# Patient Record
Sex: Female | Born: 1960 | Race: Black or African American | Hispanic: No | Marital: Married | Smoking: Never smoker
Health system: Southern US, Community
[De-identification: ages and names within clinical notes are randomized; demographics above are authoritative.]

## PROBLEM LIST (undated history)

## (undated) DIAGNOSIS — Z923 Personal history of irradiation: Secondary | ICD-10-CM

## (undated) DIAGNOSIS — N809 Endometriosis, unspecified: Secondary | ICD-10-CM

## (undated) DIAGNOSIS — Z9221 Personal history of antineoplastic chemotherapy: Secondary | ICD-10-CM

## (undated) DIAGNOSIS — E119 Type 2 diabetes mellitus without complications: Secondary | ICD-10-CM

## (undated) DIAGNOSIS — Z9289 Personal history of other medical treatment: Secondary | ICD-10-CM

## (undated) DIAGNOSIS — C50912 Malignant neoplasm of unspecified site of left female breast: Secondary | ICD-10-CM

## (undated) DIAGNOSIS — D649 Anemia, unspecified: Secondary | ICD-10-CM

## (undated) DIAGNOSIS — C50919 Malignant neoplasm of unspecified site of unspecified female breast: Secondary | ICD-10-CM

## (undated) HISTORY — PX: WISDOM TOOTH EXTRACTION: SHX21

## (undated) HISTORY — DX: Malignant neoplasm of unspecified site of left female breast: C50.912

## (undated) HISTORY — DX: Endometriosis, unspecified: N80.9

## (undated) HISTORY — PX: REDUCTION MAMMAPLASTY: SUR839

## (undated) HISTORY — PX: BREAST REDUCTION SURGERY: SHX8

## (undated) HISTORY — DX: Anemia, unspecified: D64.9

## (undated) HISTORY — DX: Type 2 diabetes mellitus without complications: E11.9

## (undated) HISTORY — DX: Personal history of other medical treatment: Z92.89

---

## 1999-03-03 ENCOUNTER — Other Ambulatory Visit: Admission: RE | Admit: 1999-03-03 | Discharge: 1999-03-03 | Payer: Self-pay | Admitting: Obstetrics and Gynecology

## 2000-05-20 ENCOUNTER — Encounter: Admission: RE | Admit: 2000-05-20 | Discharge: 2000-05-20 | Payer: Self-pay | Admitting: Obstetrics and Gynecology

## 2000-05-20 ENCOUNTER — Encounter: Payer: Self-pay | Admitting: Obstetrics and Gynecology

## 2001-08-28 ENCOUNTER — Other Ambulatory Visit: Admission: RE | Admit: 2001-08-28 | Discharge: 2001-08-28 | Payer: Self-pay | Admitting: Obstetrics and Gynecology

## 2003-08-16 ENCOUNTER — Other Ambulatory Visit: Admission: RE | Admit: 2003-08-16 | Discharge: 2003-08-16 | Payer: Self-pay | Admitting: Family Medicine

## 2003-08-19 ENCOUNTER — Encounter: Admission: RE | Admit: 2003-08-19 | Discharge: 2003-08-19 | Payer: Self-pay | Admitting: Family Medicine

## 2004-07-02 ENCOUNTER — Emergency Department (HOSPITAL_COMMUNITY): Admission: EM | Admit: 2004-07-02 | Discharge: 2004-07-02 | Payer: Self-pay | Admitting: Family Medicine

## 2004-09-15 ENCOUNTER — Encounter: Admission: RE | Admit: 2004-09-15 | Discharge: 2004-09-15 | Payer: Self-pay | Admitting: Family Medicine

## 2004-09-27 ENCOUNTER — Encounter: Admission: RE | Admit: 2004-09-27 | Discharge: 2004-09-27 | Payer: Self-pay | Admitting: Family Medicine

## 2005-02-23 ENCOUNTER — Other Ambulatory Visit: Admission: RE | Admit: 2005-02-23 | Discharge: 2005-02-23 | Payer: Self-pay | Admitting: Family Medicine

## 2005-09-18 ENCOUNTER — Encounter: Admission: RE | Admit: 2005-09-18 | Discharge: 2005-09-18 | Payer: Self-pay | Admitting: Family Medicine

## 2006-07-18 ENCOUNTER — Other Ambulatory Visit: Admission: RE | Admit: 2006-07-18 | Discharge: 2006-07-18 | Payer: Self-pay | Admitting: Family Medicine

## 2006-09-24 ENCOUNTER — Encounter: Admission: RE | Admit: 2006-09-24 | Discharge: 2006-09-24 | Payer: Self-pay | Admitting: Family Medicine

## 2007-05-08 DIAGNOSIS — C50912 Malignant neoplasm of unspecified site of left female breast: Secondary | ICD-10-CM

## 2007-05-08 HISTORY — DX: Malignant neoplasm of unspecified site of left female breast: C50.912

## 2007-10-22 ENCOUNTER — Encounter: Admission: RE | Admit: 2007-10-22 | Discharge: 2007-10-22 | Payer: Self-pay | Admitting: Family Medicine

## 2007-11-10 ENCOUNTER — Encounter: Payer: Self-pay | Admitting: Obstetrics and Gynecology

## 2007-11-10 ENCOUNTER — Encounter: Admission: RE | Admit: 2007-11-10 | Discharge: 2007-11-10 | Payer: Self-pay | Admitting: Family Medicine

## 2007-11-10 ENCOUNTER — Encounter (INDEPENDENT_AMBULATORY_CARE_PROVIDER_SITE_OTHER): Payer: Self-pay | Admitting: Family Medicine

## 2007-11-18 ENCOUNTER — Encounter: Admission: RE | Admit: 2007-11-18 | Discharge: 2007-11-18 | Payer: Self-pay | Admitting: Family Medicine

## 2007-12-06 HISTORY — PX: ABDOMINAL HYSTERECTOMY: SHX81

## 2007-12-08 ENCOUNTER — Encounter: Admission: RE | Admit: 2007-12-08 | Discharge: 2007-12-08 | Payer: Self-pay | Admitting: Surgery

## 2007-12-08 ENCOUNTER — Encounter (INDEPENDENT_AMBULATORY_CARE_PROVIDER_SITE_OTHER): Payer: Self-pay | Admitting: Surgery

## 2007-12-08 ENCOUNTER — Ambulatory Visit (HOSPITAL_COMMUNITY): Admission: RE | Admit: 2007-12-08 | Discharge: 2007-12-08 | Payer: Self-pay | Admitting: Surgery

## 2007-12-09 ENCOUNTER — Ambulatory Visit: Payer: Self-pay | Admitting: Oncology

## 2007-12-25 ENCOUNTER — Ambulatory Visit (HOSPITAL_COMMUNITY): Admission: RE | Admit: 2007-12-25 | Discharge: 2007-12-25 | Payer: Self-pay | Admitting: Oncology

## 2007-12-25 ENCOUNTER — Encounter: Payer: Self-pay | Admitting: Oncology

## 2008-01-20 ENCOUNTER — Inpatient Hospital Stay (HOSPITAL_COMMUNITY): Admission: RE | Admit: 2008-01-20 | Discharge: 2008-01-22 | Payer: Self-pay | Admitting: Surgery

## 2008-01-20 ENCOUNTER — Encounter (INDEPENDENT_AMBULATORY_CARE_PROVIDER_SITE_OTHER): Payer: Self-pay | Admitting: Surgery

## 2008-01-20 HISTORY — PX: BREAST LUMPECTOMY: SHX2

## 2008-02-09 ENCOUNTER — Ambulatory Visit: Payer: Self-pay | Admitting: Oncology

## 2008-02-11 LAB — COMPREHENSIVE METABOLIC PANEL
ALT: 13 U/L (ref 0–35)
AST: 17 U/L (ref 0–37)
Albumin: 3.8 g/dL (ref 3.5–5.2)
Calcium: 9.3 mg/dL (ref 8.4–10.5)
Chloride: 105 mEq/L (ref 96–112)
Potassium: 3.7 mEq/L (ref 3.5–5.3)

## 2008-02-11 LAB — CBC & DIFF AND RETIC
Basophils Absolute: 0 10*3/uL (ref 0.0–0.1)
Eosinophils Absolute: 0.3 10*3/uL (ref 0.0–0.5)
HCT: 31.7 % — ABNORMAL LOW (ref 34.8–46.6)
HGB: 10.3 g/dL — ABNORMAL LOW (ref 11.6–15.9)
IRF: 0.35 — ABNORMAL HIGH (ref 0.130–0.330)
MCV: 80.2 fL — ABNORMAL LOW (ref 81.0–101.0)
MONO%: 6.1 % (ref 0.0–13.0)
NEUT#: 3.4 10*3/uL (ref 1.5–6.5)
RDW: 14.1 % (ref 11.3–14.5)
RETIC #: 51.4 10*3/uL (ref 19.7–115.1)
lymph#: 2.1 10*3/uL (ref 0.9–3.3)

## 2008-02-20 LAB — CBC WITH DIFFERENTIAL/PLATELET
Basophils Absolute: 0 10*3/uL (ref 0.0–0.1)
Eosinophils Absolute: 0.1 10*3/uL (ref 0.0–0.5)
MCH: 25.8 pg — ABNORMAL LOW (ref 26.0–34.0)
MCHC: 32.7 g/dL (ref 32.0–36.0)
MONO%: 1.3 % (ref 0.0–13.0)
NEUT%: 13.2 % — ABNORMAL LOW (ref 39.6–76.8)
RDW: 14.3 % (ref 11.3–14.5)
WBC: 0.9 10*3/uL — CL (ref 3.9–10.0)

## 2008-02-20 LAB — URINALYSIS, MICROSCOPIC - CHCC
Bilirubin (Urine): NEGATIVE
Glucose: NEGATIVE g/dL
Ketones: NEGATIVE mg/dL
Leukocyte Esterase: NEGATIVE
Nitrite: NEGATIVE
Protein: <30 mg/dL
Specific Gravity, Urine: 1.015 (ref 1.003–1.035)
pH: 6 (ref 4.6–8.0)

## 2008-02-22 LAB — URINE CULTURE

## 2008-02-23 LAB — CBC WITH DIFFERENTIAL/PLATELET
Basophils Absolute: 0.1 10*3/uL (ref 0.0–0.1)
Eosinophils Absolute: 0.1 10*3/uL (ref 0.0–0.5)
HGB: 9.5 g/dL — ABNORMAL LOW (ref 11.6–15.9)
NEUT#: 2.4 10*3/uL (ref 1.5–6.5)
RDW: 13.4 % (ref 11.3–14.5)
lymph#: 1.2 10*3/uL (ref 0.9–3.3)

## 2008-02-27 LAB — COMPREHENSIVE METABOLIC PANEL
ALT: 24 U/L (ref 0–35)
AST: 23 U/L (ref 0–37)
Alkaline Phosphatase: 104 U/L (ref 39–117)
BUN: 12 mg/dL (ref 6–23)
Calcium: 9.4 mg/dL (ref 8.4–10.5)
Chloride: 106 mEq/L (ref 96–112)
Creatinine, Ser: 0.84 mg/dL (ref 0.40–1.20)
Potassium: 4.2 mEq/L (ref 3.5–5.3)

## 2008-02-27 LAB — CBC WITH DIFFERENTIAL/PLATELET
Eosinophils Absolute: 0 10*3/uL (ref 0.0–0.5)
HCT: 31.9 % — ABNORMAL LOW (ref 34.8–46.6)
HGB: 10 g/dL — ABNORMAL LOW (ref 11.6–15.9)
LYMPH%: 18.3 % (ref 14.0–48.0)
MONO#: 0.9 10*3/uL (ref 0.1–0.9)
NEUT#: 8 10*3/uL — ABNORMAL HIGH (ref 1.5–6.5)
NEUT%: 71.9 % (ref 39.6–76.8)
Platelets: 197 10*3/uL (ref 145–400)
WBC: 11.1 10*3/uL — ABNORMAL HIGH (ref 3.9–10.0)

## 2008-02-27 LAB — TECHNOLOGIST REVIEW: Technologist Review: 1

## 2008-03-04 LAB — CBC WITH DIFFERENTIAL/PLATELET
BASO%: 0.8 % (ref 0.0–2.0)
Basophils Absolute: 0 10*3/uL (ref 0.0–0.1)
EOS%: 0 % (ref 0.0–7.0)
HCT: 26.6 % — ABNORMAL LOW (ref 34.8–46.6)
HGB: 8.8 g/dL — ABNORMAL LOW (ref 11.6–15.9)
LYMPH%: 40.2 % (ref 14.0–48.0)
MCH: 26.1 pg (ref 26.0–34.0)
MCHC: 33 g/dL (ref 32.0–36.0)
MCV: 79.1 fL — ABNORMAL LOW (ref 81.0–101.0)
MONO%: 1.9 % (ref 0.0–13.0)
NEUT%: 57.1 % (ref 39.6–76.8)

## 2008-03-08 LAB — CBC & DIFF AND RETIC
EOS%: 0 % (ref 0.0–7.0)
Eosinophils Absolute: 0 10*3/uL (ref 0.0–0.5)
MCV: 79.1 fL — ABNORMAL LOW (ref 81.0–101.0)
MONO%: 16.6 % — ABNORMAL HIGH (ref 0.0–13.0)
NEUT#: 2.2 10*3/uL (ref 1.5–6.5)
RBC: 3.44 10*6/uL — ABNORMAL LOW (ref 3.70–5.32)
RDW: 14.2 % (ref 11.3–14.5)
RETIC #: 37.5 10*3/uL (ref 19.7–115.1)
Retic %: 1.1 % (ref 0.4–2.3)
WBC: 3.9 10*3/uL (ref 3.9–10.0)
lymph#: 1 10*3/uL (ref 0.9–3.3)

## 2008-03-12 LAB — CBC WITH DIFFERENTIAL/PLATELET
BASO%: 0 % (ref 0.0–2.0)
Basophils Absolute: 0 10*3/uL (ref 0.0–0.1)
EOS%: 0.1 % (ref 0.0–7.0)
Eosinophils Absolute: 0 10*3/uL (ref 0.0–0.5)
HCT: 29.6 % — ABNORMAL LOW (ref 34.8–46.6)
HGB: 9.8 g/dL — ABNORMAL LOW (ref 11.6–15.9)
LYMPH%: 11.9 % — ABNORMAL LOW (ref 14.0–48.0)
MCH: 26.4 pg (ref 26.0–34.0)
MCHC: 33.1 g/dL (ref 32.0–36.0)
MCV: 79.8 fL — ABNORMAL LOW (ref 81.0–101.0)
MONO#: 1.2 10*3/uL — ABNORMAL HIGH (ref 0.1–0.9)
MONO%: 9.5 % (ref 0.0–13.0)
NEUT#: 10 10*3/uL — ABNORMAL HIGH (ref 1.5–6.5)
NEUT%: 78.5 % — ABNORMAL HIGH (ref 39.6–76.8)
Platelets: 163 10*3/uL (ref 145–400)
RBC: 3.72 10*6/uL (ref 3.70–5.32)
RDW: 15.1 % — ABNORMAL HIGH (ref 11.3–14.5)
WBC: 12.7 10*3/uL — ABNORMAL HIGH (ref 3.9–10.0)
lymph#: 1.5 10*3/uL (ref 0.9–3.3)

## 2008-03-19 ENCOUNTER — Encounter (HOSPITAL_COMMUNITY): Admission: RE | Admit: 2008-03-19 | Discharge: 2008-05-06 | Payer: Self-pay | Admitting: Oncology

## 2008-03-19 LAB — CBC & DIFF AND RETIC
BASO%: 0.8 % (ref 0.0–2.0)
HCT: 24.5 % — ABNORMAL LOW (ref 34.8–46.6)
LYMPH%: 29.4 % (ref 14.0–48.0)
MCH: 26.2 pg (ref 26.0–34.0)
MCHC: 33.2 g/dL (ref 32.0–36.0)
MCV: 78.9 fL — ABNORMAL LOW (ref 81.0–101.0)
MONO#: 0.1 10*3/uL (ref 0.1–0.9)
NEUT%: 62.6 % (ref 39.6–76.8)
Platelets: 105 10*3/uL — ABNORMAL LOW (ref 145–400)
WBC: 1.3 10*3/uL — ABNORMAL LOW (ref 3.9–10.0)

## 2008-03-19 LAB — FERRITIN: Ferritin: 868 ng/mL — ABNORMAL HIGH (ref 10–291)

## 2008-03-23 LAB — CBC & DIFF AND RETIC
Basophils Absolute: 0 10*3/uL (ref 0.0–0.1)
EOS%: 0.1 % (ref 0.0–7.0)
Eosinophils Absolute: 0 10*3/uL (ref 0.0–0.5)
HCT: 36.9 % (ref 34.8–46.6)
HGB: 12.3 g/dL (ref 11.6–15.9)
IRF: 0.38 — ABNORMAL HIGH (ref 0.130–0.330)
MCH: 27.5 pg (ref 26.0–34.0)
MONO#: 0.9 10*3/uL (ref 0.1–0.9)
NEUT#: 4.2 10*3/uL (ref 1.5–6.5)
NEUT%: 70.4 % (ref 39.6–76.8)
RETIC #: 68.9 10*3/uL (ref 19.7–115.1)
lymph#: 0.9 10*3/uL (ref 0.9–3.3)

## 2008-03-23 LAB — HEMOGLOBINOPATHY EVALUATION
Hemoglobin Other: 0 % (ref 0.0–0.0)
Hgb A: 95.9 % — ABNORMAL LOW (ref 96.8–97.8)
Hgb F Quant: 1 % (ref 0.0–2.0)
Hgb S Quant: 0 % (ref 0.0–0.0)

## 2008-03-23 LAB — CHCC SMEAR

## 2008-03-23 LAB — MORPHOLOGY

## 2008-03-26 ENCOUNTER — Ambulatory Visit: Payer: Self-pay | Admitting: Oncology

## 2008-03-26 LAB — CBC WITH DIFFERENTIAL/PLATELET
BASO%: 1.1 % (ref 0.0–2.0)
EOS%: 0.7 % (ref 0.0–7.0)
MCH: 26.8 pg (ref 26.0–34.0)
MCHC: 32.8 g/dL (ref 32.0–36.0)
MONO#: 1 10*3/uL — ABNORMAL HIGH (ref 0.1–0.9)
NEUT%: 74.3 % (ref 39.6–76.8)
RBC: 5.1 10*6/uL (ref 3.70–5.32)
RDW: 17.7 % — ABNORMAL HIGH (ref 11.3–14.5)
WBC: 10.8 10*3/uL — ABNORMAL HIGH (ref 3.9–10.0)
lymph#: 1.6 10*3/uL (ref 0.9–3.3)

## 2008-03-26 LAB — COMPREHENSIVE METABOLIC PANEL
ALT: 15 U/L (ref 0–35)
AST: 18 U/L (ref 0–37)
CO2: 25 mEq/L (ref 19–32)
Calcium: 9.2 mg/dL (ref 8.4–10.5)
Chloride: 106 mEq/L (ref 96–112)
Potassium: 3.9 mEq/L (ref 3.5–5.3)
Sodium: 139 mEq/L (ref 135–145)
Total Protein: 7 g/dL (ref 6.0–8.3)

## 2008-04-05 LAB — CBC WITH DIFFERENTIAL/PLATELET
Basophils Absolute: 0 10*3/uL (ref 0.0–0.1)
EOS%: 0.2 % (ref 0.0–7.0)
HGB: 11.1 g/dL — ABNORMAL LOW (ref 11.6–15.9)
MCH: 27.4 pg (ref 26.0–34.0)
MONO#: 0.7 10*3/uL (ref 0.1–0.9)
NEUT#: 2.6 10*3/uL (ref 1.5–6.5)
RDW: 17.5 % — ABNORMAL HIGH (ref 11.3–14.5)
WBC: 3.9 10*3/uL (ref 3.9–10.0)
lymph#: 0.6 10*3/uL — ABNORMAL LOW (ref 0.9–3.3)

## 2008-04-09 ENCOUNTER — Ambulatory Visit: Payer: Self-pay | Admitting: Surgery

## 2008-04-09 ENCOUNTER — Ambulatory Visit: Admission: RE | Admit: 2008-04-09 | Discharge: 2008-04-09 | Payer: Self-pay | Admitting: Oncology

## 2008-04-09 ENCOUNTER — Encounter: Payer: Self-pay | Admitting: Oncology

## 2008-04-09 LAB — CBC WITH DIFFERENTIAL/PLATELET
Basophils Absolute: 0.1 10*3/uL (ref 0.0–0.1)
HCT: 33.8 % — ABNORMAL LOW (ref 34.8–46.6)
HGB: 11.3 g/dL — ABNORMAL LOW (ref 11.6–15.9)
MONO#: 0.9 10*3/uL (ref 0.1–0.9)
NEUT#: 7.9 10*3/uL — ABNORMAL HIGH (ref 1.5–6.5)
NEUT%: 79.8 % — ABNORMAL HIGH (ref 39.6–76.8)
WBC: 9.9 10*3/uL (ref 3.9–10.0)
lymph#: 1 10*3/uL (ref 0.9–3.3)

## 2008-04-16 LAB — CBC WITH DIFFERENTIAL/PLATELET
Basophils Absolute: 0.1 10*3/uL (ref 0.0–0.1)
EOS%: 0.5 % (ref 0.0–7.0)
Eosinophils Absolute: 0 10*3/uL (ref 0.0–0.5)
HCT: 32.8 % — ABNORMAL LOW (ref 34.8–46.6)
HGB: 10.9 g/dL — ABNORMAL LOW (ref 11.6–15.9)
MCH: 26.6 pg (ref 26.0–34.0)
MCV: 79.8 fL — ABNORMAL LOW (ref 81.0–101.0)
MONO%: 6.2 % (ref 0.0–13.0)
NEUT%: 80 % — ABNORMAL HIGH (ref 39.6–76.8)
lymph#: 0.6 10*3/uL — ABNORMAL LOW (ref 0.9–3.3)

## 2008-04-23 LAB — COMPREHENSIVE METABOLIC PANEL
ALT: 68 U/L — ABNORMAL HIGH (ref 0–35)
AST: 37 U/L (ref 0–37)
CO2: 25 mEq/L (ref 19–32)
Chloride: 107 mEq/L (ref 96–112)
Creatinine, Ser: 0.71 mg/dL (ref 0.40–1.20)
Sodium: 143 mEq/L (ref 135–145)
Total Bilirubin: 0.3 mg/dL (ref 0.3–1.2)
Total Protein: 6.9 g/dL (ref 6.0–8.3)

## 2008-04-23 LAB — CBC & DIFF AND RETIC
BASO%: 0.2 % (ref 0.0–2.0)
Basophils Absolute: 0 10*3/uL (ref 0.0–0.1)
EOS%: 1 % (ref 0.0–7.0)
HCT: 31.3 % — ABNORMAL LOW (ref 34.8–46.6)
HGB: 10.5 g/dL — ABNORMAL LOW (ref 11.6–15.9)
IRF: 0.29 (ref 0.130–0.330)
MCH: 27.6 pg (ref 26.0–34.0)
MONO#: 0.3 10*3/uL (ref 0.1–0.9)
NEUT%: 82.7 % — ABNORMAL HIGH (ref 39.6–76.8)
RDW: 20.3 % — ABNORMAL HIGH (ref 11.3–14.5)
RETIC #: 55.4 10*3/uL (ref 19.7–115.1)
WBC: 6.5 10*3/uL (ref 3.9–10.0)
lymph#: 0.7 10*3/uL — ABNORMAL LOW (ref 0.9–3.3)

## 2008-04-29 LAB — CBC WITH DIFFERENTIAL/PLATELET
BASO%: 1.3 % (ref 0.0–2.0)
Basophils Absolute: 0.1 10*3/uL (ref 0.0–0.1)
EOS%: 2.9 % (ref 0.0–7.0)
HCT: 31 % — ABNORMAL LOW (ref 34.8–46.6)
HGB: 10.4 g/dL — ABNORMAL LOW (ref 11.6–15.9)
LYMPH%: 15.5 % (ref 14.0–48.0)
MCH: 27.4 pg (ref 26.0–34.0)
MCHC: 33.6 g/dL (ref 32.0–36.0)
MCV: 81.5 fL (ref 81.0–101.0)
MONO%: 6.6 % (ref 0.0–13.0)
NEUT%: 73.8 % (ref 39.6–76.8)

## 2008-05-06 LAB — CBC WITH DIFFERENTIAL/PLATELET
BASO%: 0.3 % (ref 0.0–2.0)
Basophils Absolute: 0 10*3/uL (ref 0.0–0.1)
EOS%: 2.6 % (ref 0.0–7.0)
MCH: 28.4 pg (ref 26.0–34.0)
MCHC: 33.5 g/dL (ref 32.0–36.0)
MCV: 84.7 fL (ref 81.0–101.0)
MONO%: 4 % (ref 0.0–13.0)
RBC: 3.55 10*6/uL — ABNORMAL LOW (ref 3.70–5.32)
RDW: 23.4 % — ABNORMAL HIGH (ref 11.3–14.5)

## 2008-05-11 ENCOUNTER — Ambulatory Visit: Payer: Self-pay | Admitting: Oncology

## 2008-05-13 LAB — COMPREHENSIVE METABOLIC PANEL
Albumin: 3.5 g/dL (ref 3.5–5.2)
CO2: 29 mEq/L (ref 19–32)
Calcium: 9 mg/dL (ref 8.4–10.5)
Chloride: 103 mEq/L (ref 96–112)
Glucose, Bld: 168 mg/dL — ABNORMAL HIGH (ref 70–99)
Potassium: 3.5 mEq/L (ref 3.5–5.3)
Sodium: 140 mEq/L (ref 135–145)
Total Bilirubin: 0.5 mg/dL (ref 0.3–1.2)
Total Protein: 6.7 g/dL (ref 6.0–8.3)

## 2008-05-13 LAB — CBC & DIFF AND RETIC
BASO%: 0.7 % (ref 0.0–2.0)
Basophils Absolute: 0 10*3/uL (ref 0.0–0.1)
HCT: 30.3 % — ABNORMAL LOW (ref 34.8–46.6)
LYMPH%: 21.2 % (ref 14.0–48.0)
MCHC: 33.6 g/dL (ref 32.0–36.0)
MONO#: 0.2 10*3/uL (ref 0.1–0.9)
NEUT%: 67.5 % (ref 39.6–76.8)
Platelets: 195 10*3/uL (ref 145–400)
WBC: 3.6 10*3/uL — ABNORMAL LOW (ref 3.9–10.0)

## 2008-05-13 LAB — FERRITIN: Ferritin: 693 ng/mL — ABNORMAL HIGH (ref 10–291)

## 2008-05-28 ENCOUNTER — Ambulatory Visit: Admission: RE | Admit: 2008-05-28 | Discharge: 2008-08-17 | Payer: Self-pay | Admitting: Radiation Oncology

## 2008-06-02 ENCOUNTER — Encounter: Admission: RE | Admit: 2008-06-02 | Discharge: 2008-06-02 | Payer: Self-pay | Admitting: Radiation Oncology

## 2008-06-04 LAB — COMPREHENSIVE METABOLIC PANEL
ALT: 38 U/L — ABNORMAL HIGH (ref 0–35)
AST: 31 U/L (ref 0–37)
CO2: 28 mEq/L (ref 19–32)
Calcium: 8.9 mg/dL (ref 8.4–10.5)
Chloride: 102 mEq/L (ref 96–112)
Sodium: 138 mEq/L (ref 135–145)
Total Protein: 6.7 g/dL (ref 6.0–8.3)

## 2008-06-04 LAB — CBC WITH DIFFERENTIAL/PLATELET
BASO%: 0.4 % (ref 0.0–2.0)
EOS%: 1.8 % (ref 0.0–7.0)
MCH: 29.5 pg (ref 26.0–34.0)
MCHC: 33.6 g/dL (ref 32.0–36.0)
MONO#: 0.3 10*3/uL (ref 0.1–0.9)
RBC: 3.77 10*6/uL (ref 3.70–5.32)
RDW: 17.5 % — ABNORMAL HIGH (ref 11.3–14.5)
WBC: 4.1 10*3/uL (ref 3.9–10.0)
lymph#: 0.9 10*3/uL (ref 0.9–3.3)

## 2008-06-04 LAB — URINALYSIS, MICROSCOPIC - CHCC
Ketones: NEGATIVE mg/dL
Protein: NEGATIVE mg/dL
Specific Gravity, Urine: 1.015 (ref 1.003–1.035)
pH: 7 (ref 4.6–8.0)

## 2008-06-06 LAB — URINE CULTURE

## 2008-06-25 LAB — CBC WITH DIFFERENTIAL/PLATELET
BASO%: 0.5 % (ref 0.0–2.0)
EOS%: 6.2 % (ref 0.0–7.0)
MCH: 28.5 pg (ref 25.1–34.0)
MCHC: 32.6 g/dL (ref 31.5–36.0)
MONO#: 0.2 10*3/uL (ref 0.1–0.9)
RBC: 3.89 10*6/uL (ref 3.70–5.45)
RDW: 13.5 % (ref 11.2–14.5)
WBC: 3.9 10*3/uL (ref 3.9–10.3)
lymph#: 0.7 10*3/uL — ABNORMAL LOW (ref 0.9–3.3)

## 2008-06-25 LAB — COMPREHENSIVE METABOLIC PANEL
ALT: 27 U/L (ref 0–35)
AST: 20 U/L (ref 0–37)
Creatinine, Ser: 0.9 mg/dL (ref 0.40–1.20)
Total Bilirubin: 0.3 mg/dL (ref 0.3–1.2)

## 2008-07-26 ENCOUNTER — Ambulatory Visit: Payer: Self-pay | Admitting: Oncology

## 2008-07-28 LAB — CBC WITH DIFFERENTIAL/PLATELET
Basophils Absolute: 0 10*3/uL (ref 0.0–0.1)
Eosinophils Absolute: 0.3 10*3/uL (ref 0.0–0.5)
HGB: 12.4 g/dL (ref 11.6–15.9)
LYMPH%: 15.9 % (ref 14.0–49.7)
MCV: 86.4 fL (ref 79.5–101.0)
MONO#: 0.2 10*3/uL (ref 0.1–0.9)
MONO%: 8.2 % (ref 0.0–14.0)
NEUT#: 1.8 10*3/uL (ref 1.5–6.5)
Platelets: 158 10*3/uL (ref 145–400)
RBC: 4.32 10*6/uL (ref 3.70–5.45)
WBC: 2.8 10*3/uL — ABNORMAL LOW (ref 3.9–10.3)

## 2008-07-28 LAB — COMPREHENSIVE METABOLIC PANEL
Albumin: 3.6 g/dL (ref 3.5–5.2)
BUN: 9 mg/dL (ref 6–23)
CO2: 30 mEq/L (ref 19–32)
Glucose, Bld: 136 mg/dL — ABNORMAL HIGH (ref 70–99)
Potassium: 3.6 mEq/L (ref 3.5–5.3)
Sodium: 141 mEq/L (ref 135–145)
Total Bilirubin: 0.5 mg/dL (ref 0.3–1.2)
Total Protein: 7.2 g/dL (ref 6.0–8.3)

## 2008-07-29 LAB — CANCER ANTIGEN 27.29: CA 27.29: 21 U/mL (ref 0–39)

## 2008-07-29 LAB — VITAMIN D 25 HYDROXY (VIT D DEFICIENCY, FRACTURES): Vit D, 25-Hydroxy: 29 ng/mL — ABNORMAL LOW (ref 30–89)

## 2008-08-31 ENCOUNTER — Ambulatory Visit (HOSPITAL_BASED_OUTPATIENT_CLINIC_OR_DEPARTMENT_OTHER): Admission: RE | Admit: 2008-08-31 | Discharge: 2008-08-31 | Payer: Self-pay | Admitting: Surgery

## 2008-11-01 ENCOUNTER — Ambulatory Visit: Payer: Self-pay | Admitting: Oncology

## 2008-11-03 LAB — CBC & DIFF AND RETIC
Basophils Absolute: 0 10*3/uL (ref 0.0–0.1)
EOS%: 4 % (ref 0.0–7.0)
Eosinophils Absolute: 0.1 10*3/uL (ref 0.0–0.5)
HCT: 34.7 % — ABNORMAL LOW (ref 34.8–46.6)
HGB: 11.7 g/dL (ref 11.6–15.9)
LYMPH%: 23 % (ref 14.0–49.7)
MCH: 29.3 pg (ref 25.1–34.0)
MCV: 86.9 fL (ref 79.5–101.0)
MONO%: 8 % (ref 0.0–14.0)
NEUT#: 2.4 10*3/uL (ref 1.5–6.5)
NEUT%: 64.6 % (ref 38.4–76.8)
Platelets: 157 10*3/uL (ref 145–400)
RDW: 14.8 % — ABNORMAL HIGH (ref 11.2–14.5)
RETIC #: 70 10*3/uL (ref 19.7–115.1)

## 2008-11-18 ENCOUNTER — Encounter: Admission: RE | Admit: 2008-11-18 | Discharge: 2008-11-18 | Payer: Self-pay | Admitting: Oncology

## 2009-04-14 ENCOUNTER — Ambulatory Visit: Payer: Self-pay | Admitting: Oncology

## 2009-04-18 LAB — CBC WITH DIFFERENTIAL/PLATELET
BASO%: 0.4 % (ref 0.0–2.0)
Eosinophils Absolute: 0.1 10*3/uL (ref 0.0–0.5)
LYMPH%: 25.9 % (ref 14.0–49.7)
MCHC: 33.5 g/dL (ref 31.5–36.0)
MCV: 89.8 fL (ref 79.5–101.0)
MONO#: 0.4 10*3/uL (ref 0.1–0.9)
MONO%: 9.3 % (ref 0.0–14.0)
NEUT#: 3 10*3/uL (ref 1.5–6.5)
RBC: 3.92 10*6/uL (ref 3.70–5.45)
RDW: 13.6 % (ref 11.2–14.5)
WBC: 4.8 10*3/uL (ref 3.9–10.3)

## 2009-04-19 LAB — COMPREHENSIVE METABOLIC PANEL
ALT: 16 U/L (ref 0–35)
AST: 16 U/L (ref 0–37)
Albumin: 4.1 g/dL (ref 3.5–5.2)
Alkaline Phosphatase: 105 U/L (ref 39–117)
Glucose, Bld: 76 mg/dL (ref 70–99)
Potassium: 3.9 mEq/L (ref 3.5–5.3)
Sodium: 141 mEq/L (ref 135–145)
Total Bilirubin: 0.2 mg/dL — ABNORMAL LOW (ref 0.3–1.2)
Total Protein: 7.1 g/dL (ref 6.0–8.3)

## 2009-04-19 LAB — FERRITIN: Ferritin: 345 ng/mL — ABNORMAL HIGH (ref 10–291)

## 2009-04-27 ENCOUNTER — Ambulatory Visit: Admission: RE | Admit: 2009-04-27 | Discharge: 2009-04-28 | Payer: Self-pay | Admitting: Radiation Oncology

## 2009-04-27 ENCOUNTER — Ambulatory Visit (HOSPITAL_BASED_OUTPATIENT_CLINIC_OR_DEPARTMENT_OTHER): Admission: RE | Admit: 2009-04-27 | Discharge: 2009-04-27 | Payer: Self-pay | Admitting: Plastic Surgery

## 2009-05-18 ENCOUNTER — Encounter: Admission: RE | Admit: 2009-05-18 | Discharge: 2009-07-19 | Payer: Self-pay | Admitting: Oncology

## 2009-08-12 ENCOUNTER — Emergency Department (HOSPITAL_COMMUNITY): Admission: EM | Admit: 2009-08-12 | Discharge: 2009-08-13 | Payer: Self-pay | Admitting: Emergency Medicine

## 2009-08-13 ENCOUNTER — Ambulatory Visit: Payer: Self-pay | Admitting: Vascular Surgery

## 2009-08-13 ENCOUNTER — Encounter (INDEPENDENT_AMBULATORY_CARE_PROVIDER_SITE_OTHER): Payer: Self-pay | Admitting: Emergency Medicine

## 2009-08-13 ENCOUNTER — Ambulatory Visit (HOSPITAL_COMMUNITY): Admission: RE | Admit: 2009-08-13 | Discharge: 2009-08-13 | Payer: Self-pay | Admitting: Emergency Medicine

## 2009-10-14 ENCOUNTER — Ambulatory Visit: Payer: Self-pay | Admitting: Oncology

## 2009-10-18 LAB — COMPREHENSIVE METABOLIC PANEL
ALT: 20 U/L (ref 0–35)
Albumin: 3.7 g/dL (ref 3.5–5.2)
BUN: 12 mg/dL (ref 6–23)
Calcium: 8.9 mg/dL (ref 8.4–10.5)
Glucose, Bld: 105 mg/dL — ABNORMAL HIGH (ref 70–99)
Potassium: 3.7 mEq/L (ref 3.5–5.3)
Sodium: 142 mEq/L (ref 135–145)

## 2009-10-18 LAB — VITAMIN D 25 HYDROXY (VIT D DEFICIENCY, FRACTURES): Vit D, 25-Hydroxy: 19 ng/mL — ABNORMAL LOW (ref 30–89)

## 2009-10-18 LAB — CBC WITH DIFFERENTIAL/PLATELET
BASO%: 0.3 % (ref 0.0–2.0)
EOS%: 2.1 % (ref 0.0–7.0)
HCT: 35.3 % (ref 34.8–46.6)
MCH: 30 pg (ref 25.1–34.0)
MCV: 88.5 fL (ref 79.5–101.0)
MONO%: 9.2 % (ref 0.0–14.0)
NEUT#: 2.4 10*3/uL (ref 1.5–6.5)
Platelets: 171 10*3/uL (ref 145–400)
WBC: 4.2 10*3/uL (ref 3.9–10.3)

## 2009-10-19 ENCOUNTER — Encounter: Admission: RE | Admit: 2009-10-19 | Discharge: 2009-10-19 | Payer: Self-pay | Admitting: Family Medicine

## 2009-10-26 ENCOUNTER — Encounter: Payer: Self-pay | Admitting: Oncology

## 2009-10-26 ENCOUNTER — Ambulatory Visit: Payer: Self-pay | Admitting: Vascular Surgery

## 2009-10-26 ENCOUNTER — Ambulatory Visit: Admission: RE | Admit: 2009-10-26 | Discharge: 2009-10-26 | Payer: Self-pay | Admitting: Oncology

## 2009-11-21 ENCOUNTER — Ambulatory Visit: Payer: Self-pay | Admitting: Oncology

## 2009-11-21 ENCOUNTER — Ambulatory Visit (HOSPITAL_COMMUNITY): Admission: RE | Admit: 2009-11-21 | Discharge: 2009-11-21 | Payer: Self-pay | Admitting: Oncology

## 2009-11-21 ENCOUNTER — Encounter: Admission: RE | Admit: 2009-11-21 | Discharge: 2009-11-21 | Payer: Self-pay | Admitting: Oncology

## 2010-03-07 IMAGING — CT CT PELVIS W/ CM
2 of 5 series · 16 of 46 positions shown, 18 images · IV contrast (agent unspecified)
Comparison: None

CT CHEST

CLINICAL DATA: Left breast cancer, lumpectomy.  Chemotherapy to
follow

CT CHEST, ABDOMEN AND PELVIS WITH CONTRAST
TECHNIQUE: Multidetector CT imaging of the chest, abdomen and
pelvis was performed following the standard protocol during bolus
administration of intravenous contrast.
Contrast: 100 ml  Omni 300

[Series 2: cap 5.0 b40f · axial · 0.68mm/px · z∈[-553,-23]mm · 13 of 120 slices shown, 15 images]
[im 7/120  soft-tissue]
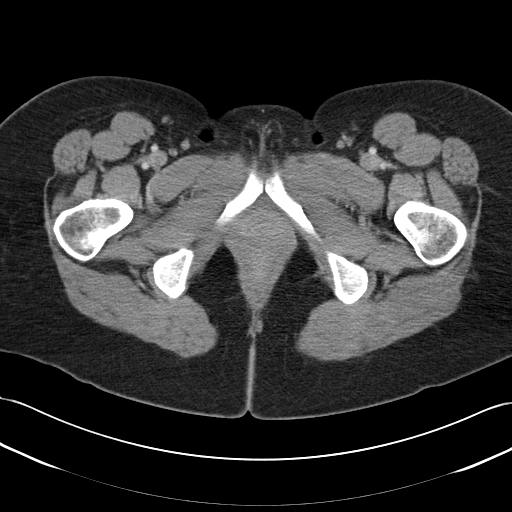
[im 7/120  bone]
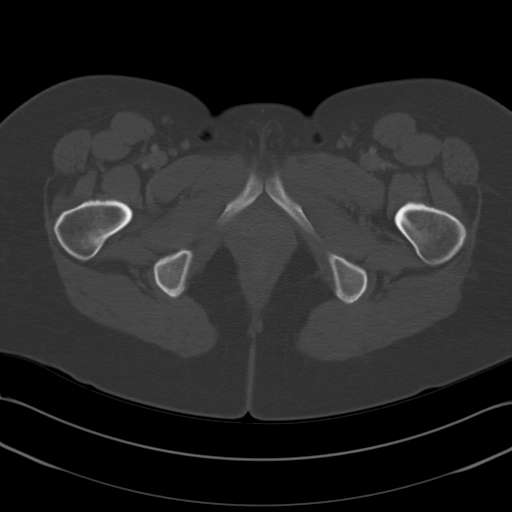
[im 14/120  soft-tissue]
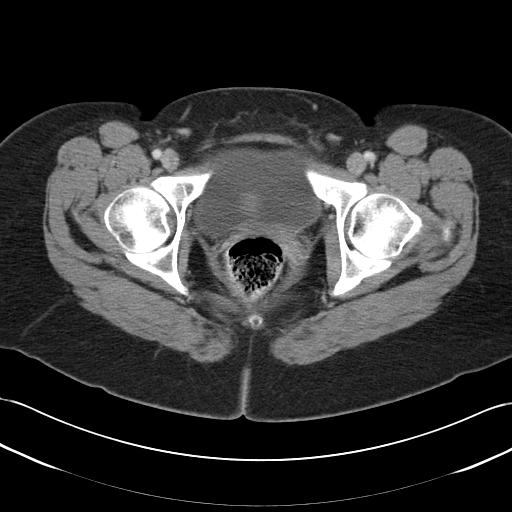
[im 27/120  soft-tissue]
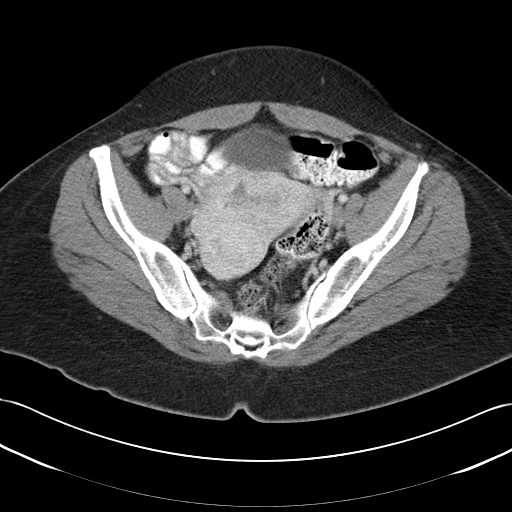
[im 34/120  soft-tissue]
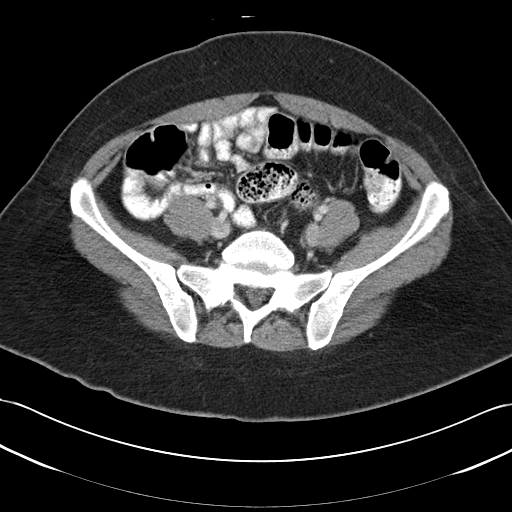
[im 40/120  soft-tissue]
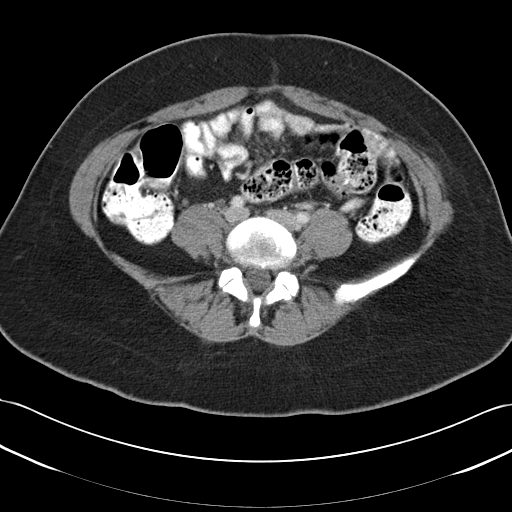
[im 53/120  soft-tissue]
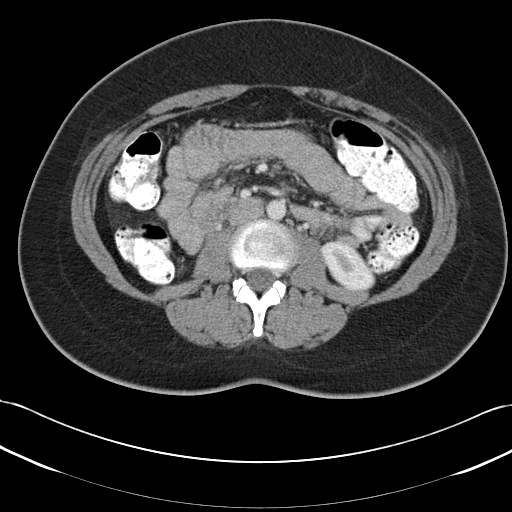
[im 60/120  soft-tissue]
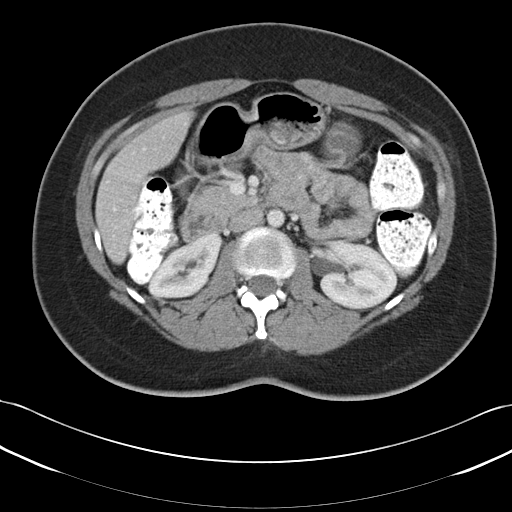
[im 67/120  soft-tissue]
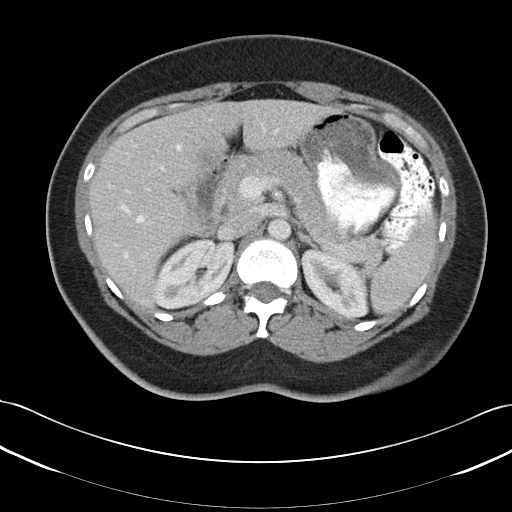
[im 80/120  soft-tissue]
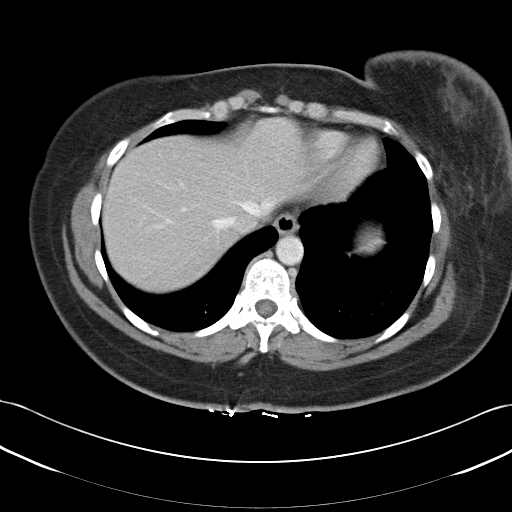
[im 80/120  bone]
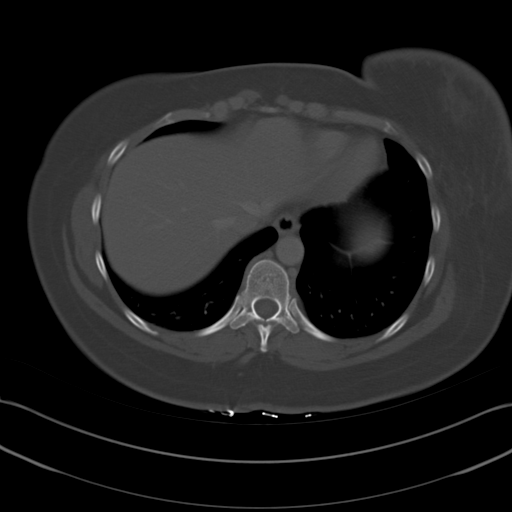
[im 86/120  soft-tissue]
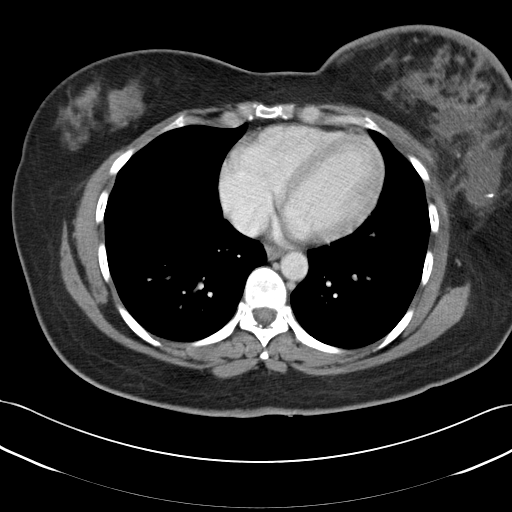
[im 93/120  soft-tissue]
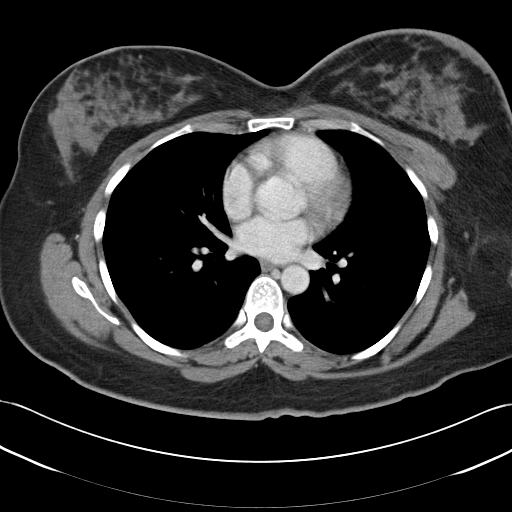
[im 106/120  soft-tissue]
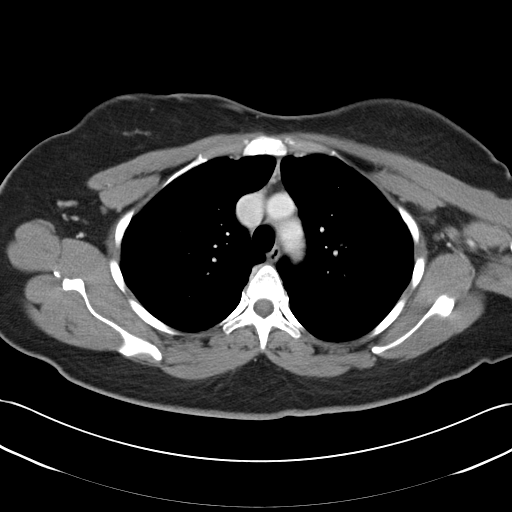
[im 113/120  soft-tissue]
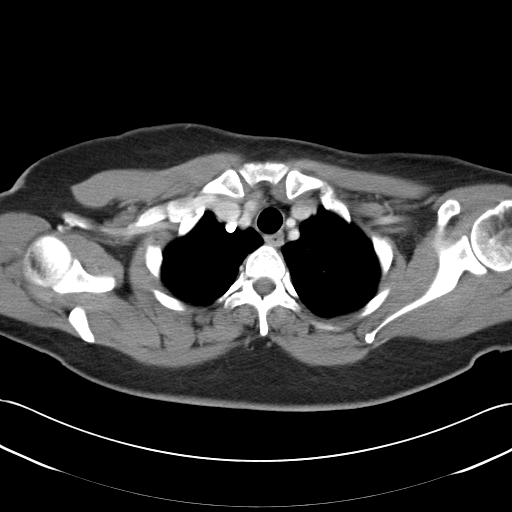

[Series 602: <mpr thick range> · coronal · 1.17mm/px · 3 of 90 slices shown]
[im 30/90  soft-tissue]
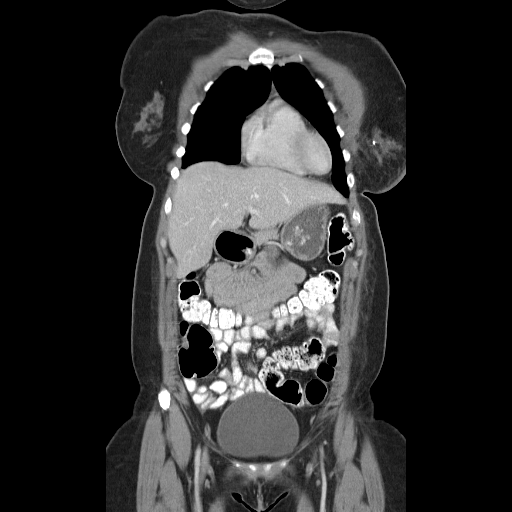
[im 40/90  soft-tissue]
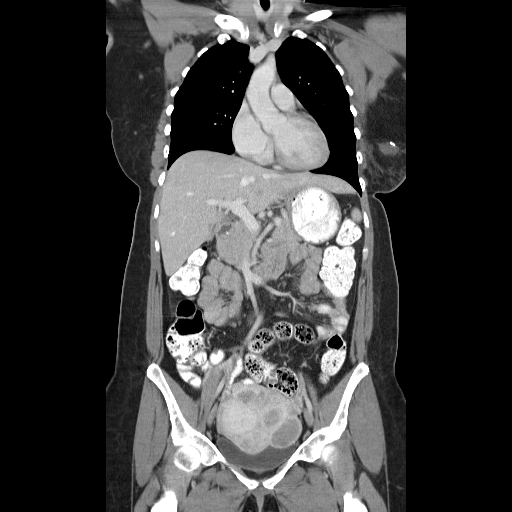
[im 50/90  soft-tissue]
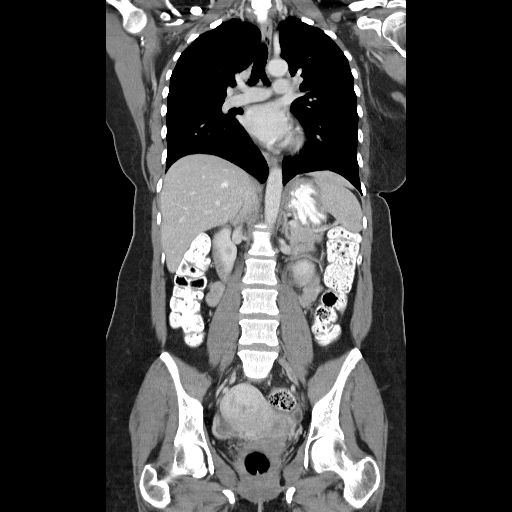

[16 of 46 positions shown; findings below may reference images not displayed]

FINDINGS: Postsurgical changes seen in the left lateral breast
with seroma and vascular clips.  There is a small 5 mm left
axillary lymph node on image 26.  High in the left chest wall there
is a 6 mm short axis infraclavicular/ subpectoralis lymph node
(image 10).  No evidence of intra mammary adenopathy.  No evidence
of mediastinal or hilar lymphadenopathy.  No right axillary
adenopathy.

Small 2 mm nodule along the left oblique fissure (image 27).  The
airway appears normal.
IMPRESSION: 1.  Postsurgical changes in the left lateral breast.
2..  Left infraclavicular lymph node measures 6 mm which is not
pathologic by CT size criteria.
3..  Small 2-3 mm pulmonary nodule is of unlikely clinical
significance.  Recommend attention on follow-up.

CT ABDOMEN
FINDINGS: No focal hepatic lesion.  The gallbladder, pancreas,
spleen, adrenal glands, and kidneys appear normal.

The stomach, small bowel, and colon appear normal.

There abdominal aorta is normal caliber.  No evidence of
retroperitoneal or periportal lymphadenopathy.
IMPRESSION: 1.  No evidence of abdominal metastasis.

CT PELVIS
FINDINGS: No free fluid in the pelvis.  Bladder appears normal.
There are multiple well circumscribed mixed density enhancing
masses within the uterine wall most consistent with intramural
leiomyomas.  The uterus is enlarged measuring 93 x 68 x 90 mm.  The
ovaries appear normal.

The rectum and sigmoid colon appear normal.

No evidence of pelvic lymphadenopathy.

Review of  bone windows demonstrates no aggressive osseous lesions.
IMPRESSION: 1.  No evidence of pelvic metastasis.
2.  Enlarged leiomyomatous uterus.

## 2010-04-06 ENCOUNTER — Ambulatory Visit: Payer: Self-pay | Admitting: Oncology

## 2010-04-10 LAB — CBC WITH DIFFERENTIAL/PLATELET
BASO%: 0.4 % (ref 0.0–2.0)
EOS%: 1.9 % (ref 0.0–7.0)
HCT: 34.2 % — ABNORMAL LOW (ref 34.8–46.6)
LYMPH%: 29 % (ref 14.0–49.7)
MCH: 29.6 pg (ref 25.1–34.0)
MCHC: 33.7 g/dL (ref 31.5–36.0)
MCV: 87.9 fL (ref 79.5–101.0)
MONO#: 0.4 10*3/uL (ref 0.1–0.9)
MONO%: 7.3 % (ref 0.0–14.0)
NEUT%: 61.4 % (ref 38.4–76.8)
Platelets: 163 10*3/uL (ref 145–400)
RBC: 3.89 10*6/uL (ref 3.70–5.45)
WBC: 5.1 10*3/uL (ref 3.9–10.3)

## 2010-04-10 LAB — COMPREHENSIVE METABOLIC PANEL
ALT: 16 U/L (ref 0–35)
AST: 22 U/L (ref 0–37)
Alkaline Phosphatase: 96 U/L (ref 39–117)
Creatinine, Ser: 0.73 mg/dL (ref 0.40–1.20)
Sodium: 143 mEq/L (ref 135–145)
Total Bilirubin: 0.3 mg/dL (ref 0.3–1.2)
Total Protein: 7 g/dL (ref 6.0–8.3)

## 2010-04-10 LAB — CANCER ANTIGEN 27.29: CA 27.29: 18 U/mL (ref 0–39)

## 2010-04-18 ENCOUNTER — Other Ambulatory Visit
Admission: RE | Admit: 2010-04-18 | Discharge: 2010-04-18 | Payer: Self-pay | Source: Home / Self Care | Admitting: Obstetrics and Gynecology

## 2010-05-27 ENCOUNTER — Other Ambulatory Visit: Payer: Self-pay | Admitting: Oncology

## 2010-05-27 DIAGNOSIS — Z853 Personal history of malignant neoplasm of breast: Secondary | ICD-10-CM

## 2010-05-27 DIAGNOSIS — Z79899 Other long term (current) drug therapy: Secondary | ICD-10-CM

## 2010-05-29 ENCOUNTER — Encounter: Payer: Self-pay | Admitting: Surgery

## 2010-06-07 ENCOUNTER — Other Ambulatory Visit: Payer: Self-pay | Admitting: Dermatology

## 2010-07-26 LAB — COMPREHENSIVE METABOLIC PANEL
AST: 24 U/L (ref 0–37)
Albumin: 3.8 g/dL (ref 3.5–5.2)
Alkaline Phosphatase: 112 U/L (ref 39–117)
CO2: 28 mEq/L (ref 19–32)
Chloride: 106 mEq/L (ref 96–112)
GFR calc Af Amer: >60 mL/min (ref >60)
GFR calc non Af Amer: >60 mL/min (ref >60)
Potassium: 3.8 mEq/L (ref 3.5–5.1)
Total Bilirubin: 0.2 mg/dL — ABNORMAL LOW (ref 0.3–1.2)

## 2010-07-26 LAB — CBC
HCT: 36.1 % (ref 36.0–46.0)
MCV: 88.8 fL (ref 78.0–100.0)
Platelets: 159 10*3/uL (ref 150–400)
RBC: 4.06 MIL/uL (ref 3.87–5.11)
WBC: 4.1 10*3/uL (ref 4.0–10.5)

## 2010-07-26 LAB — DIFFERENTIAL
Lymphs Abs: 1.6 10*3/uL (ref 0.7–4.0)
Monocytes Relative: 11 % (ref 3–12)
Neutro Abs: 1.8 10*3/uL (ref 1.7–7.7)
Neutrophils Relative %: 45 % (ref 43–77)

## 2010-07-26 LAB — URINALYSIS, ROUTINE W REFLEX MICROSCOPIC
Glucose, UA: NEGATIVE mg/dL
Hgb urine dipstick: NEGATIVE
Protein, ur: NEGATIVE mg/dL
Specific Gravity, Urine: 1.021 (ref 1.005–1.030)

## 2010-07-26 LAB — PROTIME-INR
INR: 0.94 (ref 0.00–1.49)
Prothrombin Time: 12.5 seconds (ref 11.6–15.2)

## 2010-07-26 LAB — APTT: aPTT: 27 seconds (ref 24–37)

## 2010-09-05 ENCOUNTER — Ambulatory Visit
Admission: RE | Admit: 2010-09-05 | Discharge: 2010-09-05 | Disposition: A | Discharge status: Home / Self Care | Payer: 59 | Source: Ambulatory Visit | Attending: Oncology | Admitting: Oncology

## 2010-09-05 DIAGNOSIS — Z853 Personal history of malignant neoplasm of breast: Secondary | ICD-10-CM

## 2010-09-05 DIAGNOSIS — Z79899 Other long term (current) drug therapy: Secondary | ICD-10-CM

## 2010-09-12 ENCOUNTER — Encounter (HOSPITAL_BASED_OUTPATIENT_CLINIC_OR_DEPARTMENT_OTHER): Payer: 59 | Admitting: Oncology

## 2010-09-12 DIAGNOSIS — C50419 Malignant neoplasm of upper-outer quadrant of unspecified female breast: Secondary | ICD-10-CM

## 2010-09-12 DIAGNOSIS — M7989 Other specified soft tissue disorders: Secondary | ICD-10-CM

## 2010-09-12 DIAGNOSIS — Z170 Estrogen receptor positive status [ER+]: Secondary | ICD-10-CM

## 2010-09-19 NOTE — Op Note (Signed)
NAME:  Linda Kramer, Linda Kramer              ACCOUNT NO.:  [id number]   MEDICAL RECORD NO.:  [id number]          PATIENT TYPE:  AMB   LOCATION:  SDS                          FACILITY:  [facility]   PHYSICIAN:  Currie Paris, M.D.DATE OF BIRTH:  11-04-1960   DATE OF PROCEDURE:  12/08/2007  DATE OF DISCHARGE:                               OPERATIVE REPORT   OFFICE MEDICAL RECORD NUMBER [id number].   PREOPERATIVE DIAGNOSIS:  Left breast cancer, upper outer quadrant,  clinical stage II.   POSTOPERATIVE DIAGNOSIS:  Left breast cancer, upper outer quadrant,  clinical stage II.   OPERATION:  Needle localized left lumpectomy (partial mastectomy) with  blue dye injection and axillary sentinel lymph node biopsy (4 lymph  nodes).   SURGEON:  Dr. Jamey Ripa.   ANESTHESIA:  General.   CLINICAL HISTORY:  This is a 50 year old lady with a known left breast  cancer just into the upper outer quadrant.  There was an adjacent 1-cm  satellite nodule.  The whole area measured about 3.4 cm, and it was  difficult to tell whether the primary was over 2 cm in size.  After a  lengthy discussion with the patient, she elected to proceed to a left  lumpectomy with guidewire localization so we could make sure we had both  the primary and the satellite lesion as well sentinel lymph node  evaluation.   DESCRIPTION OF PROCEDURE:  The patient was seen in the holding area and  had no further questions.  We marked the left breast as the operative  side.  We confirmed the operative plans as noted above.  She was  injected with radioactive isotope prior to my seeing her in the holding  area.   The patient was taken to the operating room.  After satisfactory general  anesthesia had been obtained, we did the time out.  The left breast was  prepped with some alcohol and 5 mL of dilute methylene blue injected and  massaged in.  The entire breast was then prepped and draped.   The guidewire was entered laterally and tracked  medially and almost in  the midline of the breast in a horizontal fashion.  I made an elliptical  incision and raised flaps and went completely around this and tried to  get down to the chest wall but ended up leaving a little bit of tissue  deep on the initial lumpectomy, but I was well around the tumor I  thought in all directions, and the medial end of the lumpectomy was  beyond the tips of either of the guidewires.  This was sent for specimen  mammography.  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   At this point, I turned my attention to the axillary area.  Using  Neoprobe, I identified a hot area in the transverse incision and entered  the axilla.  The first most superficial lymph node I came to was blue  with blue lymphatic  leading into it, and I took this out.  It had counts  of about 500.  Just slightly further in were 2 more adjacent lymph nodes  that were also blue, and these were taken out, second and third, and one  of them had counts of about 1600 and the other counts of about 500.  With these 3 out, there was still another hot area just a little bit  more superior, and that was dissected out and found to be a fourth lymph  node which had no blue dye but had counts to about 400 to 500, and it  was removed.  Upon removing that, there were no more high counts in the  axilla.  The hottest node was sent as node #1 and the adjacent node as  node #2.  The most superficial node was node #3, and the highest node  was node #4.   I made sure everything was dry.  I did clip a small blood vessel that I  thought was associated with the fourth lymph node.   I turned my attention back to the breast and went ahead and took a  shave margin getting part of the medial, superior, inferior and deep  margins so that the deep margin was now the chest  wall.  I labeled this  so we knew which side was the new margin and then spent some time  irrigating and making sure everything was dry.  I put some Marcaine in.  I closed this with a little deep suture to try to reapproximate the  fascia over the muscle and then more superficial leaving a little bit of  the cavity to form and then finally the skin with some 4-0 Monocryl  subcuticular and Dermabond.  The radiologist reported that the specimen  mammogram looked good.   I turned my attention back to the axilla put some local in here and  closed with 3-0 Vicryl and 4-0 Monocryl subcuticular.  Dr. Esther Hardy  reported that the 3 nodes were negative although node #3 (which was the  most superficial) had some suspicious cells but not diagnostic.  I  elected not to complete a node dissection without confirmation that we  did have definite positive nodes, especially since the other 3 appeared  to be at least at this point negative.   The patient tolerated the procedure well, and there were no operative  complications.  All counts were correct.      Currie Paris, M.D.  Electronically Signed     CJS/MEDQ  D:  12/08/2007  T:  12/08/2007  Job:  [id number]   cc:   Stacie Acres. Cliffton Asters, M.D.  Fax: [phone]   Charles A. Sydnee Cabal, MD  Fax: [phone]

## 2010-09-19 NOTE — H&P (Signed)
NAME:  Linda Kramer, Linda Kramer              ACCOUNT NO.:  [id number]   MEDICAL RECORD NO.:  [id number]          PATIENT TYPE:  AMB   LOCATION:  SDC                           [facility]ACILITY:  [facility]   PHYSICIAN:  Charles A. Delcambre, MDDATE OF BIRTH:  05/29/1960   DATE OF ADMISSION:  DATE OF DISCHARGE:                              HISTORY & PHYSICAL   She is a 50 year old gravida 0, para 0-0-0-0 with menorrhagia, passing  clots, clothing mishaps, bed hygiene problems, and blood gushing down  her clothes when she stands from sitting.  When she is on her period she  has fatigue and cold symptoms as well and has been anemic to hemoglobin  of 10.1 per Dr. Cliffton Asters at [facility].  Ultrasound has shown multiple  fibroids of 3.3, 3.7, 3.2, and 3.0 cm.   PAST MEDICAL HISTORY:  Anxiety and anemia.   SURGICAL HISTORY:  Laparoscopic management of endometriosis.   MEDICATIONS:  Desogen alone.   She is under the care of a psychiatrist for anxiety.   ALLERGIES:  Some type of dye that she cannot be more specific.   SOCIAL HISTORY:  No tobacco, ethanol, or drug use.  She is with her  husband, monogamous.  She does have some dyspareunia.  She is  monogamous.  She has 2 adopted children.   FAMILY HISTORY:  Hypertension and diabetes, otherwise negative.   REVIEW OF SYSTEMS:  Negative, full review.  Denies fever, chills,  rashes, lesions, headaches, dizziness, seasonal allergies, chest pain,  shortness of breath, wheezing, diarrhea, constipation, bleeding, melena,  hematochezia, urgency, frequency, dysuria, incontinence, hematuria, and  galactorrhea, or emotional changes.  Positive for anemia, hygienic  accidents, menorrhagia, and dyspareunia.   PHYSICAL EXAMINATION:  GENERAL:  Well developed and well nourished, in  no apparent distress.  Alert and oriented x3.  VITAL SIGNS:  Blood pressure 140/88, pulse 80, and respirations 16.  LUNGS:  Clear bilaterally.  HEART:  Regular rate and rhythm without murmur, rub,  or gallop.  ABDOMEN:  Soft and flat, uterus is palpable to about 16 weeks.  It would  be amenable to a transverse incision.  Bimanual examination, no masses  palpable.   ASSESSMENT:  1. Menorrhagia.  2. Fibroid uterus.   PLAN:  I discussed alternatives to include uterine artery ablation.  Depo-Provera for progesterone suppression, cycling with birth control  pills.  Depo-Provera with oral contraceptive therapy being marginally  contraindicated at her age and borderline hypertension.  She elects to  proceed with hysterectomy via abdominal route, and gives informed  consent, accepts risks of infection, bleeding, bowel or bladder damage,  ureteral damage, blood product risk including hepatitis and HIV,  incisional wound infection, and DVT.  All questions were answered and  she gives informed consent.  Preoperatively, we will get a CBC, type and  screen, urine pregnancy test.  SCDs, on to the OR on preoperative 2  grams of Ancef.      Charles A. Sydnee Cabal, MD  Electronically Signed     CAD/MEDQ  D:  10/27/2007  T:  10/28/2007  Job:  [id number]

## 2010-09-19 NOTE — H&P (Signed)
NAME:  Linda Kramer, Linda Kramer              ACCOUNT NO.:  [id number]   MEDICAL RECORD NO.:  [id number]          PATIENT TYPE:  AMB   LOCATION:                                [facility]  [facility]   PHYSICIAN:  Charles A. Delcambre, MDDATE OF BIRTH:  [birth year]   DATE OF ADMISSION:  03/30/2008  DATE OF DISCHARGE:                              HISTORY & PHYSICAL   A 50 year old gravida 0, para 0-0-0-0 with menorrhagia, passing clots,  clothing mishaps, bed hygiene problems and blood gushing down her  clothes when she stands from sitting.  When she is on her periods, she  has fatigue and cold symptoms as well as being anemic with hemoglobin  10.1 per Dr. Laurann Montana at [facility], her PCP.  Ultrasound has  shown multiple fibroids 3.3, 3.7, 3.2, and 3.0 cm.  She was to have  surgery back in June but had diagnosis meanwhile of breast cancer, has  undergone therapy for breast cancer with neoadjuvant therapy and per Dr.  Jamey Ripa, we will be proceeding with surgery on January 20, 2008, at 1  p.m. or at a time that fits with the surgeon.  All questions were  answered, and she gives informed consent from my point of view for  infection, bleeding, bowel and bladder damage, blood product risks  including hepatitis and HIV exposure, DVT risk, incision infection risk,  prolonged hospital stay with multiple surgeries as well as longer  anesthetic time.  She will remain n.p.o. past midnight.  Preoperative  CBC, type and screen, and BMET will be done, and serial pregnancy test  will be done.   PAST MEDICAL HISTORY:  1. Anxiety.  2. Anemia.   PAST SURGICAL HISTORY:  Laparoscopic management of endometriosis.   MEDICATIONS:  Desogen alone.  She is under the care of psychiatrist for  anxiety.   ALLERGIES:  Some type of DYE that she cannot be more specific.   SOCIAL HISTORY:  No tobacco or drug use.  She lives with her husband,  monogamous.  She does have some dyspareunia, and she is monogamous.  She  has 2  adopted children.   FAMILY HISTORY:  Hypertension and diabetes, otherwise negative.   REVIEW OF SYSTEMS:  Negative for review.  Denies fever, chills, rashes,  lesions, headaches, dizziness, seasonal allergies, chest pain, shortness  of breath, wheezing, diarrhea, constipation, bleeding, melena,  hematochezia, urgency, frequency, dysuria, incontinence, hematuria,  galactorrhea, or emotional changes.  Positive for anemia, hygienic  accidents, menorrhagia, and dyspareunia as noted above.   PHYSICAL EXAMINATION:  VITAL SIGNS:  Alert and oriented x3, afebrile,  blood pressure 130/84, heart rate is 72, respiration is 20.  CORONARY:  Regular rate and rhythm.  LUNGS:  Clear bilaterally.  ABDOMEN:  Uterus palpable up to 14 weeks, nontender.  PELVIC EXAM:  Normal external female genitalia.  The vaginal vault  without lesions.  No cervical motion tenderness.  Uterus is enlarged as  noted above.  Ovaries are not palpable bilaterally secondary to the  enlarged uterus and fibroids present irregularly.  EXTREMITIES:  Nontender.   ASSESSMENT:  Enlarged uterine leiomyomata, dyspareunia, and pelvic  pain.   PLAN:  TVH, BSO, A&P repair, concurrent with surgery for breast  carcinoma with Dr. Jamey Ripa.  She will remain n.p.o. past midnight.  Gives  informed consent for surgery.      Charles A. Sydnee Cabal, MD  Electronically Signed     CAD/MEDQ  D:  01/05/2008  T:  01/06/2008  Job:  [id number]

## 2010-09-19 NOTE — Discharge Summary (Signed)
NAMEBRANDIE, Linda Kramer              ACCOUNT NO.:  [id number]   MEDICAL RECORD NO.:  [id number]          PATIENT TYPE:  INP   LOCATION:  5149                         FACILITY:  [facility]   PHYSICIAN:  Charles A. Delcambre, MDDATE OF BIRTH:  Mar 03, 1961   DATE OF ADMISSION:  01/20/2008  DATE OF DISCHARGE:  01/22/2008                               DISCHARGE SUMMARY   PRIMARY DISCHARGE DIAGNOSES:  1. Menorrhagia.  2. A 14-week leiomyomata.  3. Left breast cancer.   PROCEDURES:  1. Transabdominal hysterectomy.  2. Bilateral salpingo-oophorectomy.  3. Left partial mastectomy.  4. Left axillary node dissection.  5. Right PAC.   DISPOSITION:  The patient is discharged home to follow up on January 26, 2008, to discontinue staples.  Otherwise, breast care followup with  Dr. Jamey Ripa as instructed.  She is given convalescent instructions to  notify temperature greater than 100 degrees, increased pain, or any  significant vaginal bleeding.  She is precautioned about erythema or  drainage about the incision, either incision site.  She is instructed to  convalesce at home.  She did complain of some swelling at the time of  discharge, but on examination, there was no appreciable difference and  negative for calf tenderness and popliteal tenderness on exam, but she  is precautioned regarding increased swelling on the right greater than  left, asymmetrical, or any leg pain to notify.  She was given  prescription for Motrin 800 mg 1 p.o. q.8 h. p.r.n. #30, refill x1 and  Percocet 5/325 1-2 p.o. q.4 h. p.r.n. #40.   PATHOLOGY:  Breast tissue lymph nodes per Dr. Jamey Ripa.  Uterus, tubes,  and ovaries per myself to pathology.  Pathology pending at this time.   History and physicals are dictated on the chart.   LABORATORY:  Postoperative hematocrit 31.0, hemoglobin 10.1.   DESCRIPTION OF PROCEDURE:  The patient was taken to the operating room  and underwent breast surgery with Dr. Jamey Ripa.  Thereafter,  she was  turned over to my care for abdominal hysterectomy.  Procedures went  well.  She was postoperatively in good pain control with Dilaudid PCA.  She did well under Dr. Tenna Child care and would qualify for discharge on  postoperative day #1.  However, not ready on GYN side for discharge on  postoperative day  #1.  She returned flatus day 1 and was given general diet.  Pain was  controlled after discontinuation of PCA with p.o. Percocet and Motrin.  She was ambulating, voiding once the catheter was out postoperative day  #1 and continues to do well and was therefore discharged home on  postoperative day #2 to follow up as noted above.      Charles A. Sydnee Cabal, MD  Electronically Signed     CAD/MEDQ  D:  01/22/2008  T:  01/22/2008  Job:  [id number]   cc:   Currie Paris, M.D.

## 2010-09-19 NOTE — Op Note (Signed)
Linda Kramer, Linda Kramer NO.:  [id number]   MEDICAL RECORD NO.:  [id number]          PATIENT TYPE:  OIB   LOCATION:  5149                         FACILITY:  [facility]   PHYSICIAN:  Currie Paris, M.D.DATE OF BIRTH:  Apr 05, 1961   DATE OF PROCEDURE:  01/20/2008  DATE OF DISCHARGE:                               OPERATIVE REPORT   CHIEF COMPLAINT:  Breast cancer.   HISTORY OF PRESENT ILLNESS:  This patient is a 50 year old lady who  previously had a fairly extensive lumpectomy and sentinel node for left  breast cancer.  Her nodes were positive and she needed a node  dissection.  Her margins at lumpectomy were positive and we discussed  total mastectomy, but she wished to make every attempt to have  conservative surgery, and as she had a large area involved we elected to  do a partial mastectomy to include the nipple-areolar complex to see if  we could completely get negative margins.  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  In addition, she planned to have a  hysterectomy for ongoing bleeding issues that had actually been  scheduled prior to her being diagnosed with breast cancer.   DESCRIPTION OF PROCEDURE:  The patient was seen in the holding area.  She had no further questions.  We all confirmed the plans for surgery.   The patient was accidentally injected with radioisotope as apparently  she was scheduled for sentinel node which was not planned since she  already had the sentinel node.  That was discussed with the patient and  subsequently with her family.   The patient then was taken to the operating room and after satisfactory  general anesthesia had been obtained, both breasts and upper chest into  the left axilla were prepped and draped as a sterile field.  The time-  out was done.   I began by doing the  partial mastectomy.  I made an elliptical incision  going just medial to the areola, around the areola, and laterally well  around by a couple of centimeters the old scar.  I divided the breast  tissue down towards the chest wall.  We had a wide excision of the  entire portion of her breast from her nipple areolar complex all the way  towards the latissimus.  This was all done with cautery.  I made sure  everything looked dry.  I thought I was well around my old margins.  I  then closed with interrupted 2-0 or 3-0 Vicryl followed by staples on  the skin.   Attention was then turned to the axilla.  The old incision was made and  extended.  I divided the fatty tissue down to the chest wall, identified  anteriorly the pectoralis and posteriorly latissimus.  I tried to open  along the clavipectoral fascia, but there were lot of postoperative  changes from her sentinel node and this dissection was tedious and  difficult because of fused dissection planes.  So, I got up toward the  pectoralis.  I found the vessels coming to the pectoralis and clipped  those.  I identified the area of the vein, but I did not completely  clear the vein to dissect it out thoroughly because again there was a  lot of inflammatory tissue and I was concerned of tearing the vein.  Nevertheless, I was able to see remove the axillary contents below the  vein from medial to lateral and superior to inferior identifying the  vessels to the latissimus and stripping the material off them.  I spent  a long time looking for the long thoracic nerve and I thought I could  identify it, but I could never see it function.  It may have been tied  up with some of the postoperative changes such that it was cut as I  could not identify one definitely, but I did not see anything in terms  of where I was dividing tissue that would have been the nerve and it may  well have been pushed a little bit more laterally than normal.   I finally  removed the attachments to the latissimus and this completed  the axillary dissection.   I irrigated and made sure everything was dry.  I placed a #19 Blake  drain.  I closed with 3-0 Vicryl and staples.   Using new instruments, gloves, counts, etc., attention was turned to the  right subclavian area.  I entered the subclavian vein on initial attempt  and threaded the guidewire and failed to advance and using fluoro I  could see that it was turning up into the jugular vein in the neck.  I  tried to back this up and manipulated with fluoro to get it to go  centrally but was unsuccessful.  The guidewire was removed, then I made  a second attempt to cannulate the vein with the needle and guide in and  again the guidewire was threaded into the neck.   I made a third attempt this time going about a centimeter and half more  medially, again got good access into the vein and this time the  guidewire threaded easily into the superior vena cava.   A short transverse anterior chest wall incision was made in a pocket  fashion.  Port-A-Cath tubing was brought from the pocket into the  guidewire site.  The guidewire site was dilated once and the dilator and  peel-away sheath removed and the catheter advanced to 20 cm.  It  aspirated and flushed easily.  The peel-away sheath was removed.   Using fluoro, I backed this up to about 17 cm where I thought we were at  the distal superior vena cava.  It aspirated and flushed easily.  The  reservoir was flushed, attached, and locking mechanism engaged.  It  aspirated and flushed readily.   The reservoir was sutured to the fascia with 2-0 Prolene.  A final  fluoro check was made and was seen to have good positioning and no  kinks.  The incisions were then closed with 3-0 Vicryl, 4-0 Monocryl,  subcuticular, and Dermabond.  At this point, Dr. Sydnee Cabal came into do  the other portion of the procedure.      Currie Paris, M.D.  Electronically  Signed     CJS/MEDQ  D:  01/20/2008  T:  01/21/2008  Job:  [id number]

## 2010-09-19 NOTE — Op Note (Signed)
NAME:  Linda Kramer, Linda Kramer              ACCOUNT NO.:  [id number]   MEDICAL RECORD NO.:  [id number]          PATIENT TYPE:  OIB   LOCATION:  5149                         FACILITY:  [facility]   PHYSICIAN:  Charles A. Delcambre, MDDATE OF BIRTH:  [birth year]   DATE OF PROCEDURE:  01/20/2008  DATE OF DISCHARGE:                               OPERATIVE REPORT   PREOPERATIVE DIAGNOSES:  1. Menorrhagia.  2. 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  3. Breast cancer.   POSTOPERATIVE DIAGNOSES:  1. Menorrhagia.  2. 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  3. Breast cancer.   PROCEDURE:  Transabdominal hysterectomy, bilateral salpingo-  oophorectomy, a separate procedure prior and the patient turned over to  me.  Dr. Jamey Ripa completed left partial mastectomy and left axillary node  dissection and placement of drain.  After his completion of the portion  of surgery, I proceeded with the abdominal portion of her entire case.   SURGEON:  Charles A. Sydnee Cabal, MD   ASSISTANT:  Gerald Leitz, MD   COMPLICATIONS:  None.   ESTIMATED BLOOD LOSS:  300 mL with my part of the case.   FINDINGS:  A 14-week uterus, multiple myomata, normal ovaries  bilaterally.  Uterus, tubes, and ovaries sent to Pathology, bilateral  ovaries to Pathology.   ANESTHESIA:  General by the endotracheal route.   INSTRUMENT, SPONGE, AND NEEDLE COUNT:  Correct x2.   DESCRIPTION OF PROCEDURE:  The patient was taken to the operating room  and placed in supine position.  After procedure was completed from Dr.  Jamey Ripa, the patient was turned over to my care in supine position.  Sterile prep and drape was undertaken and Pfannenstiel incision was made  with a knife, carried down to fascia.  Fascia was incised with a knife  and Mayo scissors.  Sharp dissection was used to extend and release the  rectus sheath superiorly and inferiorly.  Peritoneum was entered bluntly  with fingers.  There was no damage to bowel, bladder, or vascular  structures.  Peritoneum  was stretched to further release.  Balfour  retractor was placed.  Morrison laps x3 were used to pack the pelvis.  Uterine cornual regions were grasped with Kelly clamps.  Round ligaments  were transected bilaterally and transfixion stitched.  Broad ligament  was opened.  The skin on peritoneum was taken down by undermining the  peritoneum on lower portion of the uterus.  The ureters were seen  bilaterally opening the broad ligament on the left and directly  transperitoneally on the right.  Infundibulopelvic pedicles were  isolated and transfixion stitched after simple tie.  Hemostasis was  excellent.  Sharp dissection was used to take the bladder down off the  uterus and some blunt dissection yielded good mobilization.  The uterine  vessels were skeletonized and taken in transfixion stitches 0 Vicryl  with good hemostasis bilaterally, successive pedicles taken down the  remainder of the cardinal ligaments and finally including the  uterosacral ligaments for all transfixion stitch with 0 Vicryl.  Cervix  was amputated from vagina prior to the final uterosacral pedicles for  visualization and the cervix was amputated from the corpus  uterus and  the hysterectomy was completed with final pedicles under better  visualization once the corpus uterus had been removed.  Richardson angle  sutures of 0 Vicryl and then running-locking 0 Vicryl was used to close  the cuff.  Irrigation was carried out and minor electrocautery was used  on peritoneal edges on the right side of the cuff area.  Bladder  hemostasis was excellent.  Irrigation was carried out x2.  Hemostasis  was excellent.  All instruments were removed.  Laps were removed.  Balfour retractor was removed.  Subfascial hemostasis was achieved with  minor electrocautery.  The fascia was then closed with 0 PDS running  nonlocking suture.  Subcutaneous irrigation was carried down.  Hemostasis was good.  Skin staples were used to close the skin.   Sterile  dressing was applied.  The patient was taken to recovery with physician  having tolerated the procedure well.      Charles A. Sydnee Cabal, MD  Electronically Signed     CAD/MEDQ  D:  01/20/2008  T:  01/21/2008  Job:  [id number]   cc:   Currie Paris, M.D.

## 2010-12-12 ENCOUNTER — Other Ambulatory Visit: Payer: Self-pay | Admitting: Oncology

## 2010-12-12 DIAGNOSIS — Z9889 Other specified postprocedural states: Secondary | ICD-10-CM

## 2010-12-12 DIAGNOSIS — Z853 Personal history of malignant neoplasm of breast: Secondary | ICD-10-CM

## 2010-12-21 ENCOUNTER — Ambulatory Visit
Admission: RE | Admit: 2010-12-21 | Discharge: 2010-12-21 | Disposition: A | Discharge status: Home / Self Care | Payer: 59 | Source: Ambulatory Visit | Attending: Oncology | Admitting: Oncology

## 2010-12-21 DIAGNOSIS — Z9889 Other specified postprocedural states: Secondary | ICD-10-CM

## 2010-12-21 DIAGNOSIS — Z853 Personal history of malignant neoplasm of breast: Secondary | ICD-10-CM

## 2011-01-31 ENCOUNTER — Ambulatory Visit (HOSPITAL_COMMUNITY)
Admission: RE | Admit: 2011-01-31 | Discharge: 2011-01-31 | Disposition: A | Discharge status: Home / Self Care | Payer: 59 | Source: Ambulatory Visit | Attending: Oncology | Admitting: Oncology

## 2011-01-31 ENCOUNTER — Other Ambulatory Visit: Payer: Self-pay | Admitting: Oncology

## 2011-01-31 DIAGNOSIS — R209 Unspecified disturbances of skin sensation: Secondary | ICD-10-CM | POA: Insufficient documentation

## 2011-01-31 DIAGNOSIS — C50919 Malignant neoplasm of unspecified site of unspecified female breast: Secondary | ICD-10-CM

## 2011-01-31 DIAGNOSIS — R4789 Other speech disturbances: Secondary | ICD-10-CM | POA: Insufficient documentation

## 2011-01-31 DIAGNOSIS — R29898 Other symptoms and signs involving the musculoskeletal system: Secondary | ICD-10-CM | POA: Insufficient documentation

## 2011-01-31 MED ORDER — GADOBENATE DIMEGLUMINE 529 MG/ML IV SOLN
20.0000 mL | Freq: Once | INTRAVENOUS | Status: AC | PRN
  Administered 2011-01-31: 17 mL via INTRAVENOUS

## 2011-02-02 LAB — CBC
HCT: 35.7 — ABNORMAL LOW
Hemoglobin: 11.5 — ABNORMAL LOW
MCHC: 32.3
MCV: 80.6
RDW: 17 — ABNORMAL HIGH

## 2011-02-02 LAB — URINE MICROSCOPIC-ADD ON

## 2011-02-02 LAB — DIFFERENTIAL
Lymphocytes Relative: 32
Lymphs Abs: 1.8
Monocytes Relative: 8
Neutro Abs: 3.3
Neutrophils Relative %: 58

## 2011-02-02 LAB — COMPREHENSIVE METABOLIC PANEL
BUN: 8
Calcium: 9.4
Creatinine, Ser: 0.77
GFR calc non Af Amer: >60
Glucose, Bld: 108 — ABNORMAL HIGH
Sodium: 137
Total Protein: 7.2

## 2011-02-02 LAB — URINALYSIS, ROUTINE W REFLEX MICROSCOPIC
Glucose, UA: NEGATIVE
Leukocytes, UA: NEGATIVE
Specific Gravity, Urine: 1.023

## 2011-02-03 ENCOUNTER — Other Ambulatory Visit (HOSPITAL_COMMUNITY): Payer: 59

## 2011-02-05 LAB — CBC
MCV: 81.2
Platelets: 199
RDW: 14.7
WBC: 10.7 — ABNORMAL HIGH

## 2011-02-06 LAB — CROSSMATCH: ABO/RH(D): B POS

## 2011-02-07 LAB — DIFFERENTIAL
Basophils Absolute: 0
Basophils Relative: 1
Eosinophils Absolute: 0.2
Monocytes Relative: 6
Neutro Abs: 3.8
Neutrophils Relative %: 59

## 2011-02-07 LAB — HCG, SERUM, QUALITATIVE: Preg, Serum: NEGATIVE

## 2011-02-07 LAB — BASIC METABOLIC PANEL
BUN: 12
CO2: 26
Calcium: 8.9
Chloride: 105
Creatinine, Ser: 0.55
GFR calc Af Amer: >60

## 2011-02-07 LAB — CBC
MCHC: 32.5
MCV: 81.2
Platelets: 214
RBC: 4.07
RDW: 14.8

## 2011-02-07 LAB — URINALYSIS, ROUTINE W REFLEX MICROSCOPIC
Glucose, UA: NEGATIVE
Ketones, ur: NEGATIVE
Leukocytes, UA: NEGATIVE
Protein, ur: NEGATIVE
Urobilinogen, UA: 0.2

## 2011-02-07 LAB — ABO/RH: ABO/RH(D): B POS

## 2011-02-07 LAB — URINE MICROSCOPIC-ADD ON

## 2011-02-07 LAB — TYPE AND SCREEN: Antibody Screen: NEGATIVE

## 2011-04-11 ENCOUNTER — Other Ambulatory Visit: Payer: Self-pay | Admitting: Oncology

## 2011-04-11 ENCOUNTER — Other Ambulatory Visit (HOSPITAL_BASED_OUTPATIENT_CLINIC_OR_DEPARTMENT_OTHER): Payer: 59 | Admitting: Lab

## 2011-04-11 DIAGNOSIS — Z170 Estrogen receptor positive status [ER+]: Secondary | ICD-10-CM

## 2011-04-11 DIAGNOSIS — M7989 Other specified soft tissue disorders: Secondary | ICD-10-CM

## 2011-04-11 DIAGNOSIS — C50419 Malignant neoplasm of upper-outer quadrant of unspecified female breast: Secondary | ICD-10-CM

## 2011-04-11 LAB — COMPREHENSIVE METABOLIC PANEL
AST: 22 U/L (ref 0–37)
Albumin: 3.9 g/dL (ref 3.5–5.2)
Alkaline Phosphatase: 128 U/L — ABNORMAL HIGH (ref 39–117)
Chloride: 101 mEq/L (ref 96–112)
Potassium: 3.8 mEq/L (ref 3.5–5.3)
Sodium: 138 mEq/L (ref 135–145)
Total Protein: 7.8 g/dL (ref 6.0–8.3)

## 2011-04-11 LAB — CBC WITH DIFFERENTIAL/PLATELET
EOS%: 2.6 % (ref 0.0–7.0)
Eosinophils Absolute: 0.1 10*3/uL (ref 0.0–0.5)
MCH: 28.7 pg (ref 25.1–34.0)
MCV: 87.6 fL (ref 79.5–101.0)
MONO%: 6.6 % (ref 0.0–14.0)
NEUT#: 3 10*3/uL (ref 1.5–6.5)
RBC: 3.99 10*6/uL (ref 3.70–5.45)
RDW: 13.9 % (ref 11.2–14.5)
lymph#: 1.7 10*3/uL (ref 0.9–3.3)

## 2011-04-18 ENCOUNTER — Ambulatory Visit (HOSPITAL_BASED_OUTPATIENT_CLINIC_OR_DEPARTMENT_OTHER): Payer: 59 | Admitting: Physician Assistant

## 2011-04-18 ENCOUNTER — Telehealth: Payer: Self-pay | Admitting: *Deleted

## 2011-04-18 ENCOUNTER — Encounter: Payer: Self-pay | Admitting: Physician Assistant

## 2011-04-18 VITALS — BP 129/83 | HR 87 | Temp 97.9°F | Ht 64.5 in | Wt 188.3 lb

## 2011-04-18 DIAGNOSIS — C50919 Malignant neoplasm of unspecified site of unspecified female breast: Secondary | ICD-10-CM

## 2011-04-18 DIAGNOSIS — R61 Generalized hyperhidrosis: Secondary | ICD-10-CM

## 2011-04-18 DIAGNOSIS — N63 Unspecified lump in unspecified breast: Secondary | ICD-10-CM

## 2011-04-18 DIAGNOSIS — Z170 Estrogen receptor positive status [ER+]: Secondary | ICD-10-CM

## 2011-04-18 DIAGNOSIS — C50119 Malignant neoplasm of central portion of unspecified female breast: Secondary | ICD-10-CM

## 2011-04-18 DIAGNOSIS — C50912 Malignant neoplasm of unspecified site of left female breast: Secondary | ICD-10-CM | POA: Insufficient documentation

## 2011-04-18 MED ORDER — GABAPENTIN 300 MG PO CAPS: 300.0000 mg | ORAL_CAPSULE | Freq: Every evening | ORAL | Status: DC | PRN

## 2011-04-18 NOTE — Telephone Encounter (Signed)
gave patient appointment for Linda Kramer on 07-02-2011 at 3:00pm gave patient appointment for mammogram in 12-2011 gave patient appointment for 09-2011

## 2011-04-18 NOTE — Patient Instructions (Addendum)
To prevent loss of bone density: Continue calcium and Vit D supplements daily. Walk daily, 30 min.  See Dr. Cliffton Asters for annual exam  - consider thyroid testing due to hair loss.  Gabapentin - take 1 tablet at night for hot flashes and post-surgical pain.

## 2011-04-18 NOTE — Progress Notes (Signed)
Hematology and Oncology Follow Up Visit  Linda Kramer [id number] February 12, 1961 50 y.o. 04/18/2011    HPI: Linda Kramer is a [place] woman who is referred to [place] in August 2009 at the age of 79 for treatment of her left breast carcinoma.  The patient had her routine screening mammogram on 10/22/2007 which showed a possible mass in the left breast. A left diagnostic mammogram and ultrasound on 11/10/2007 with spot compression views showed a 2.5 cm mass in the upper left breast posteriorly, also palpable. All ultrasound confirmed a 2.6 the regular hypoechoic lesion with an adjacent 1 cm angulated satellite lesion. Biopsy on 11/10/2007 [id number] and [id number]) showed an invasive ductal carcinoma, ER positive at 87%, PR positive at 12%, HER-2/neu negative, with a very low proliferation fraction of 3%.  Bilateral breast MRIs were obtained 11/18/2007, again noting a 2.3 cm oval mass in the left breast with spiculated margins and a satellite nodule immediately anterior to this. Together, both masses measured 3.4 cm. No abnormality noted in the right breast.  Patient underwent left lumpectomy and sentinel lymph node sampling on 12/08/2007 under the care of Dr. Jamey Ripa, with final pathology [id number]) confirming a 3.8 cm invasive ductal carcinoma, grade 2 with 2 of 4 sentinel lymph nodes involved. 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  Patient underwent additional surgery for clear margin in September 2009. Consisted of a left partial mastectomy with removal of the areolar  complex. Axillary node dissection was also performed, an additional 6 lymph nodes evaluated, one of which was involved with metastatic ductal carcinoma. At the time of surgery, patient also underwent total abdominal hysterectomy and bilateral salpingo-oophorectomy with benign pathology. [id number])  Patient is known to be BRCA1 and BRCA2 negative.  The patient underwent adjuvant chemotherapy  consisting of 4 dose dense cycles of doxorubicin and cyclophosphamide followed by 5 weekly doses of paclitaxel, but paclitaxel discontinued due to increased peripheral neuropathy which eventually resolved. Patient then received radiation therapy, completed in March 2010.  Patient began on tamoxifen in March of 2010 and continued until August of 2012 at which time tamoxifen was discontinued and letrozole was started, 2.5 mg daily. Continues now on letrozole daily.  Interim History:   Patient was seen today for followup of her left breast carcinoma. She continues on letrozole daily which she began in August of 2012 and is tolerating well. She does have hot flashes, and he sometimes interrupted her sleep. She does have joint pain, although this existed even prior to the letrozole. Her knees tend to bother her, as do her hands. These are more problematic in the morning, and once she is up and walking around this seems to improve slightly. She's had no increased vaginal dryness. She has noted a little hair loss and wonders if this could be associated with the letrozole. I will mention that she has not had her thyroid tested, although she is due for a routine visit with Dr. Cliffton Asters. The patient has some blurry vision on and off for which she is seeing her ophthalmologist. She thinks she needs glasses. 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 She does have lymphedema as well in the left upper extremity status post surgery.   Interval history is remarkable for the patient having contact us in September of this year with complaints of headaches, right-sided numbness and weakness, and speech  difficulty. She was subsequently sent for an MRI of the head, with and without contrast, which showed no evidence of metastatic disease. MRI was negative for acute infarct. It did show scattered small subcortical white matter with  nonenhancing hyperintensities bilaterally, with differential diagnoses including chronic microvascular ischemia, migraine headaches, demyelinating disease, or chemotherapy-related changes.  A detailed review of systems is otherwise noncontributory as noted below.  Review of Systems: Constitutional:  Hot flashes, no weight loss, fever, night sweats and feels well Eyes: blurry vision WUJ:[id number] Cardiovascular: no chest pain or dyspnea on exertion Respiratory: no cough, shortness of breath, or wheezing Neurological: negative Dermatological: negative Gastrointestinal: no abdominal pain, change in bowel habits, or black or bloody stools Genito-Urinary: negative Hematological and Lymphatic: negative Breast: positive for - pain in the left breast Musculoskeletal: positive for - joint pain Remaining ROS negative.  Family History: The patient's father died from a heart attack at age 23. The patient's mother is alive and in her 45s. The patient is one of 8 siblings, 5 girls and 3 boys. One sister died in an automobile accident remotely. There is no known breast or ovarian cancer in the family.  Gynecologic History: GX P0, status post total abdominal hysterectomy and bilateral salpingo-oophorectomy in August 2009.  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 Her husband, Nira Retort, is an Dietitian. They have 2 adopted daughters, [place] and [place]. The patient is a Control and instrumentation engineer.  Medications: I have reviewed the patient's current medications. Current Outpatient Prescriptions  Medication Sig Dispense Refill  . calcium & magnesium carbonates (MYLANTA) 311-232 MG per tablet Take 1 tablet by mouth daily.        . cholecalciferol (VITAMIN D) 400 UNITS TABS Take 2,000 Units by mouth daily.        . citalopram (CELEXA) 40 MG tablet Take 40 mg by mouth daily.        . fish oil-omega-3 fatty acids 1000 MG capsule Take 1 g by mouth daily.         Marland Kitchen ibuprofen (ADVIL,MOTRIN) 200 MG tablet Take 200 mg by mouth every 6 (six) hours as needed.        Marland Kitchen letrozole (FEMARA) 2.5 MG tablet Take 2.5 mg by mouth daily.        . Multiple Vitamin (MULTIVITAMIN) tablet Take 1 tablet by mouth daily.        Marland Kitchen gabapentin (NEURONTIN) 300 MG capsule Take 1 capsule (300 mg total) by mouth at bedtime as needed (hot flashes and post surgical pain).  30 capsule  6    Allergies: No Known Allergies  Physical Exam: Filed Vitals:   04/18/11 0905  BP: 129/83  Pulse: 87  Temp: 97.9 F (36.6 C)   HEENT:  Sclerae anicteric, conjunctivae pink.  Oropharynx clear.  No mucositis or candidiasis.   Nodes:  No cervical, supraclavicular, or axillary lymphadenopathy palpated.  Breast Exam:  Right breast, benign with no masses, discharge, nipple inversion, or nipple discharge. Left breast, status post lumpectomy. Well-healed incision with no suspicious nodularity or skin changes. No evidence of local recurrence. Breast is slightly tender to palpation. Lungs:  Clear to auscultation bilaterally.  No crackles, rhonchi, or wheezes.   Heart:  Regular rate and rhythm.   Abdomen:  Soft, nontender.  Positive bowel sounds.  No organomegaly or masses palpated.   Musculoskeletal:  No focal spinal tenderness to palpation.  Extremities:  Benign.  No peripheral edema or cyanosis.   Skin:  Benign.   Neuro:  Nonfocal.   Lab Results: Lab Results  Component Value Date   WBC 5.3 04/11/2011   HGB 11.4* 04/11/2011   HCT 34.9 04/11/2011   MCV 87.6 04/11/2011   PLT 185 04/11/2011   NEUTROABS 3.0 04/11/2011     Chemistry      Component Value Date/Time   NA 138 04/11/2011 1518   NA 138 04/11/2011 1518   K 3.8 04/11/2011 1518   K 3.8 04/11/2011 1518   CL 101 04/11/2011 1518   CL 101 04/11/2011 1518   CO2 30 04/11/2011 1518   CO2 30 04/11/2011 1518   BUN 16 04/11/2011 1518   BUN 16 04/11/2011 1518   CREATININE 0.82 04/11/2011 1518   CREATININE 0.82 04/11/2011 1518      Component Value  Date/Time   CALCIUM 9.9 04/11/2011 1518   CALCIUM 9.9 04/11/2011 1518   ALKPHOS 128* 04/11/2011 1518   ALKPHOS 128* 04/11/2011 1518   AST 22 04/11/2011 1518   AST 22 04/11/2011 1518   ALT 25 04/11/2011 1518   ALT 25 04/11/2011 1518   BILITOT 0.1* 04/11/2011 1518   BILITOT 0.1* 04/11/2011 1518        Radiological Studies: DIGITAL DIAGNOSTIC BILATERAL MAMMOGRAM WITH CAD  Comparison: 11/21/2009, 11/18/2008 and 10/22/2007 as well as  06/02/2008.  Findings: Exam demonstrates scattered fibroglandular densities.  The lumpectomy site over the deep third of the left outer mid  breast can only be partially visualized despite multiple attempts  and appears without significant change. Remainder of the exam is  unchanged.  Mammographic images were processed with CAD.  IMPRESSION:  Stable post lumpectomy changes of the left outer mid breast.  Recommendations: Recommend continued annual bilateral diagnostic  mammographic evaluation.  BI-RADS CATEGORY 2: Benign finding(s).    MRI HEAD WITHOUT AND WITH CONTRAST  Technique: Multiplanar, multiecho pulse sequences of the brain and  surrounding structures were obtained according to standard protocol  without and with intravenous contrast  Contrast: 17mL MULTIHANCE GADOBENATE DIMEGLUMINE 529 MG/ML IV SOLN  Comparison: None.  Findings: Ventricle size is normal. Negative for hydrocephalus.  Diffusion weighted imaging is negative for acute infarct.  Brainstem and cerebellum are intact.  Scattered sub centimeter white matter hyperintensities are noted in  the cerebral white matter bilaterally. These are in the  subcortical white matter and do not enhance. Negative for  hemorrhage or edema.  Postcontrast imaging of the brain reveals no enhancing lesions.  IMPRESSION:  Negative for acute infarct.  Negative for metastatic disease.  Scattered small subcortical white matter nonenhancing  hyperintensities bilaterally. Differential diagnosis includes    chronic microvascular ischemia, migraine headaches, demyelinating  disease, and possibly chemotherapy related changes.  Original Report Authenticated By: Camelia Phenes, M.D    Assessment:  50 year old [place] woman  1. Status post left lumpectomy and sentinel lymph node biopsy in August 2009 for a T2 N1, stage IIB, invasive ductal carcinoma. Tumor was ER PR positive, HER-2/neu negative, with a low proliferation fraction.  2. 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  3. Received radiation therapy, completed March 2010  4. Started on tamoxifen in March of 2010 and continued until August of 2012.  5. Began on letrozole in August 2012 and continues a 2.5 mg daily. The plan is to complete a total of 5 years of antiestrogen therapy.  6. Status post TAH/BSO in August 2009  7. Patient is known to  be BRCA1 and 2 negative  Plan:  The patient is doing well with regards to her breast cancer, and there's no clinical evidence of recurrence. She'll continue on the letrozole which she seems to be tolerating well. She is having some hot flashes, as well as what appears to be postsurgical pain in the left breast. We will try a little gabapentin at night, 300 mg at bedtime, to see if this helps with both issues.  The patient will return to see Dr. Cliffton Asters for routine followup, and possibly for thyroid testing. She'll also followup with Dr. Cliffton Asters if she has any additional headaches (which I will note have resolved.). She'll have her annual mammogram in August and will return to see [place] in 6 months with repeat labs and followup visit. Her alkaline phosphatase was just slightly elevated today for the first time so we will keep a close eye on this, and we'll investigate further if it is still elevated in 6 months. Of note liver enzymes were otherwise normal.   This plan was reviewed with the  patient, who voices understanding and agreement.  She knows to call with any changes or problems.    Osualdo Hansell, PA-C 04/18/2011

## 2011-05-14 ENCOUNTER — Other Ambulatory Visit: Payer: Self-pay | Admitting: Obstetrics and Gynecology

## 2011-05-14 ENCOUNTER — Other Ambulatory Visit (HOSPITAL_COMMUNITY)
Admission: RE | Admit: 2011-05-14 | Discharge: 2011-05-14 | Disposition: A | Discharge status: Home / Self Care | Payer: 59 | Source: Ambulatory Visit | Attending: Obstetrics and Gynecology | Admitting: Obstetrics and Gynecology

## 2011-05-14 DIAGNOSIS — Z01419 Encounter for gynecological examination (general) (routine) without abnormal findings: Secondary | ICD-10-CM | POA: Insufficient documentation

## 2011-06-08 ENCOUNTER — Telehealth: Payer: Self-pay | Admitting: *Deleted

## 2011-06-08 NOTE — Telephone Encounter (Signed)
Received phone call from pt requesting refill on meds: celexa & femara from [facility].  Returned call & she would like a 76 d suppy of each b/c her husbands insurance is coming to an end at end of Feb.  Will discuss with Amy or Dr. Darnelle Catalan next week.

## 2011-06-12 ENCOUNTER — Other Ambulatory Visit: Payer: Self-pay | Admitting: *Deleted

## 2011-06-12 DIAGNOSIS — C50919 Malignant neoplasm of unspecified site of unspecified female breast: Secondary | ICD-10-CM

## 2011-06-12 MED ORDER — LETROZOLE 2.5 MG PO TABS: 2.5000 mg | ORAL_TABLET | Freq: Every day | ORAL | Status: DC

## 2011-06-12 MED ORDER — CITALOPRAM HYDROBROMIDE 40 MG PO TABS: 40.0000 mg | ORAL_TABLET | Freq: Every day | ORAL | Status: DC

## 2011-06-15 ENCOUNTER — Other Ambulatory Visit: Payer: Self-pay | Admitting: *Deleted

## 2011-06-27 ENCOUNTER — Other Ambulatory Visit: Payer: Self-pay | Admitting: *Deleted

## 2011-06-27 ENCOUNTER — Telehealth: Payer: Self-pay | Admitting: Oncology

## 2011-06-27 ENCOUNTER — Telehealth: Payer: Self-pay | Admitting: *Deleted

## 2011-06-27 NOTE — Telephone Encounter (Signed)
S/w the pt and she is aware of her march appt °

## 2011-06-27 NOTE — Telephone Encounter (Signed)
Pt called with complaints of ongoing body/joint aching post start of letrozole. She is having swelling in fingers as well.  Above symptoms are interfering with ADL's as well as quality of life.  Per discussion Tracie has not initiated use of gabapentin (she read insert and noted medication used for seizures).  Auriel is requesting to see someone to discuss symptoms and what to do.  Plan per call is for pt to stop the letrozole at present. Monitor x 1 week symptoms- If not lessening she can initiate gabapentin for benefit. ( discussed clinical use of med per her dx )  appt will be made with provider for mid march due to pt will be losing her insurance at end of march.

## 2011-07-31 ENCOUNTER — Telehealth: Payer: Self-pay | Admitting: *Deleted

## 2011-07-31 ENCOUNTER — Ambulatory Visit (HOSPITAL_BASED_OUTPATIENT_CLINIC_OR_DEPARTMENT_OTHER): Payer: 59 | Admitting: Oncology

## 2011-07-31 VITALS — BP 138/83 | HR 90 | Temp 98.5°F | Ht 64.5 in | Wt 189.3 lb

## 2011-07-31 DIAGNOSIS — C50919 Malignant neoplasm of unspecified site of unspecified female breast: Secondary | ICD-10-CM

## 2011-07-31 DIAGNOSIS — Z170 Estrogen receptor positive status [ER+]: Secondary | ICD-10-CM

## 2011-07-31 DIAGNOSIS — C50912 Malignant neoplasm of unspecified site of left female breast: Secondary | ICD-10-CM

## 2011-07-31 DIAGNOSIS — IMO0001 Reserved for inherently not codable concepts without codable children: Secondary | ICD-10-CM

## 2011-07-31 DIAGNOSIS — M2550 Pain in unspecified joint: Secondary | ICD-10-CM

## 2011-07-31 MED ORDER — TAMOXIFEN CITRATE 20 MG PO TABS: 20.0000 mg | ORAL_TABLET | Freq: Every day | ORAL | Status: AC

## 2011-07-31 MED ORDER — NAPROXEN 500 MG PO TABS: 500.0000 mg | ORAL_TABLET | Freq: Three times a day (TID) | ORAL | Status: AC | PRN

## 2011-07-31 NOTE — Progress Notes (Signed)
ID: Linda Kramer   DOB: [birth year]  MR#: [id number]  WUJ#:[id number]  HISTORY OF PRESENT ILLNESS: The patient had her routine screening mammogram on 10/22/2007 which showed a possible mass in the left breast. A left diagnostic mammogram and ultrasound on 11/10/2007 with spot compression views showed a 2.5 cm mass in the upper left breast posteriorly, also palpable. All ultrasound confirmed a 2.6 the regular hypoechoic lesion with an adjacent 1 cm angulated satellite lesion. Biopsy on 11/10/2007 [phone] and [id number]) showed an invasive ductal carcinoma, ER positive at 87%, PR positive at 12%, HER-2/neu negative, with a very low proliferation fraction of 3%.  Bilateral breast MRIs were obtained 11/18/2007, again noting a 2.3 cm oval mass in the left breast with spiculated margins and a satellite nodule immediately anterior to this. Together, both masses measured 3.4 cm. No abnormality noted in the right breast.  Patient underwent left lumpectomy and sentinel lymph node sampling on 12/08/2007 under the care of Dr. Jamey Ripa, with final pathology [id number]) confirming a 3.8 cm invasive ductal carcinoma, grade 2 with 2 of 4 sentinel lymph nodes involved. 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  Patient underwent additional surgery for clear margin in September 2009. Consisted of a left partial mastectomy with removal of the areolar complex. Axillary node dissection was also performed, an additional 6 lymph nodes evaluated, one of which was involved with metastatic ductal carcinoma. At the time of surgery, patient also underwent total abdominal hysterectomy and bilateral salpingo-oophorectomy with benign pathology. [phone])  INTERVAL HISTORY: Had returns today for followup of her breast cancer. She called about a month ago complaining of hair loss, loss of energy, and diffuse aches and pains. We asked her to stop her letrozole and comes today to see if her symptoms  improve with that change.  REVIEW OF SYSTEMS: Her symptoms are better but they certainly have not cleared. She feels like an old woman. She hurts all over, particularly her wrists, and knees, below the knees, and hips. There is diffuse malaise in addition to this, with muscle aches and cramps as well as just generalized fatigue. She is able to work full-time despite this. She has lost a fair amount of hair. Some likely unrelated problems include a bit of a runny nose, and a mild dry cough, and constipation. She is having moderate hot flashes. A detailed review of systems was otherwise noncontributory  PAST MEDICAL HISTORY: Past Medical History  Diagnosis Date  . Breast cancer, left breast 04/18/2011  Significant for fibroids, varicose veins, anemia, endometriosis and wisdom teeth removal.  FAMILY HISTORY The patient's father died from a heart attack at age 66. The patient's mother is alive and in her 56s. The patient is one of 8 siblings, 5 girls and 3 boys. One sister died in an automobile accident remotely. There is no known breast or ovarian cancer in the family.   GYNECOLOGIC HISTORY: GX P0, status post total abdominal hysterectomy and bilateral salpingo-oophorectomy in August 2009.  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 Her husband, Nira Retort, is an Dietitian. They have 2 adopted daughters, [place] and [place]. The patient is a Control and instrumentation engineer.    ADVANCED DIRECTIVES:  HEALTH MAINTENANCE: History  Substance Use Topics  . Smoking status: Never Smoker   . Smokeless tobacco: Never Used  . Alcohol Use: 0.6 oz/week    1 Glasses of wine per week     Colonoscopy: never  PAP:  UTD  Bone density: never  Lipid panel: unk  No Known Allergies  Current Outpatient Prescriptions  Medication Sig Dispense Refill  . calcium & magnesium carbonates (MYLANTA) 311-232 MG per tablet Take 1 tablet by mouth daily.        . citalopram  (CELEXA) 40 MG tablet Take 1 tablet (40 mg total) by mouth daily.  90 tablet  0  . fish oil-omega-3 fatty acids 1000 MG capsule Take 1 g by mouth daily.        Marland Kitchen gabapentin (NEURONTIN) 300 MG capsule Take 1 capsule (300 mg total) by mouth at bedtime as needed (hot flashes and post surgical pain).  30 capsule  6  . ibuprofen (ADVIL,MOTRIN) 200 MG tablet Take 200 mg by mouth every 6 (six) hours as needed.        Marland Kitchen letrozole (FEMARA) 2.5 MG tablet Take 1 tablet (2.5 mg total) by mouth daily.  90 tablet  0  . Multiple Vitamin (MULTIVITAMIN) tablet Take 1 tablet by mouth daily.        . cholecalciferol (VITAMIN D) 400 UNITS TABS Take 2,000 Units by mouth daily.        . naproxen (NAPROSYN) 500 MG tablet Take 1 tablet (500 mg total) by mouth 3 (three) times daily with meals as needed.  60 tablet  1  . tamoxifen (NOLVADEX) 20 MG tablet Take 1 tablet (20 mg total) by mouth daily.  90 tablet  12    OBJECTIVE: Middle-aged [place] American woman who appears uncomfortable  Filed Vitals:   07/31/11 1617  BP: 138/83  Pulse: 90  Temp: 98.5 F (36.9 C)     Body mass index is 31.99 kg/(m^2).    ECOG FS: 1  Sclerae unicteric Oropharynx clear No peripheral adenopathy Lungs no rales or rhonchi Heart regular rate and rhythm Abd benign MSK no focal spinal tenderness, no peripheral edema, and also no swelling in the MP or IP joints of either hand Neuro: nonfocal Breasts: Right breast, no suspicious masses; left breast status post lumpectomy and radiation; no evidence of local recurrence  LAB RESULTS: Lab Results  Component Value Date   WBC 5.3 04/11/2011   NEUTROABS 3.0 04/11/2011   HGB 11.4* 04/11/2011   HCT 34.9 04/11/2011   MCV 87.6 04/11/2011   PLT 185 04/11/2011      Chemistry      Component Value Date/Time   NA 138 04/11/2011 1518   NA 138 04/11/2011 1518   K 3.8 04/11/2011 1518   K 3.8 04/11/2011 1518   CL 101 04/11/2011 1518   CL 101 04/11/2011 1518   CO2 30 04/11/2011 1518   CO2 30 04/11/2011  1518   BUN 16 04/11/2011 1518   BUN 16 04/11/2011 1518   CREATININE 0.82 04/11/2011 1518   CREATININE 0.82 04/11/2011 1518      Component Value Date/Time   CALCIUM 9.9 04/11/2011 1518   CALCIUM 9.9 04/11/2011 1518   ALKPHOS 128* 04/11/2011 1518   ALKPHOS 128* 04/11/2011 1518   AST 22 04/11/2011 1518   AST 22 04/11/2011 1518   ALT 25 04/11/2011 1518   ALT 25 04/11/2011 1518   BILITOT 0.1* 04/11/2011 1518   BILITOT 0.1* 04/11/2011 1518       Lab Results  Component Value Date   LABCA2 18 04/11/2011   LABCA2 18 04/11/2011    No components found with this basename: [id number]    No results found for this basename: INR:1;PROTIME:1 in the last 168 hours  Urinalysis  Component Value Date/Time   COLORURINE YELLOW 08/12/2009 2004   APPEARANCEUR CLOUDY* 08/12/2009 2004   LABSPEC 1.021 08/12/2009 2004   LABSPEC 1.015 06/04/2008 1559   PHURINE 6.5 08/12/2009 2004   GLUCOSEU NEGATIVE 08/12/2009 2004   HGBUR NEGATIVE 08/12/2009 2004   BILIRUBINUR NEGATIVE 08/12/2009 2004   KETONESUR NEGATIVE 08/12/2009 2004   PROTEINUR NEGATIVE 08/12/2009 2004   UROBILINOGEN 0.2 08/12/2009 2004   NITRITE NEGATIVE 08/12/2009 2004   LEUKOCYTESUR NEGATIVE MICROSCOPIC NOT DONE ON URINES WITH NEGATIVE PROTEIN, BLOOD, LEUKOCYTES, NITRITE, OR GLUCOSE <1000 mg/dL. 08/12/2009 2004    STUDIES: No new results found. Mammography due August 2013  ASSESSMENT: 51 year old BRCA 1-2 negative [place] woman   1. Status post left lumpectomy and sentinel lymph node biopsy in August 2009 for a T2 N1, stage IIB, invasive ductal carcinoma, grade 2, estrogen receptor 87% positive, progesterone receptor 12% positive, with an MIB-1 of 3% and no HER-2 amplification   2. 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   3. Received radiation therapy, completed March 2010   4. Started on tamoxifen in March of 2010 and continued until August of  2012.   5. Began on letrozole in August 2012, discontinued February 2013 because of arthralgias/myalgias   6. Status post TAH/BSO in August 2009 with benign pathology    PLAN: I think she simply is not going to be able to tolerate letrozole. I do expect she will continue to improve over the next month or so, but just in case I am obtaining some lab work tomorrow to rule out rheumatoid arthritis. She is going to see [place] again in 6 weeks. I have asked her not to restart the tamoxifen until her symptoms have resolved. I wrote her a prescription for Naprosyn 500 mg to take up to 3 times a day with food so she can continue to function until things improve. I also suggested she will or a wrist splint on her right wrist at night and see if that helps with the possible secondary problem of carpal tunnel.  She knows to call for any problems that may develop before the next visit   Dujuan Stankowski C    07/31/2011

## 2011-07-31 NOTE — Telephone Encounter (Signed)
gave patient appointment for 08-31-2011 lab only 09-24-2011 with amy berry

## 2011-08-01 ENCOUNTER — Telehealth: Payer: Self-pay | Admitting: Oncology

## 2011-08-01 ENCOUNTER — Ambulatory Visit (HOSPITAL_BASED_OUTPATIENT_CLINIC_OR_DEPARTMENT_OTHER): Payer: 59 | Admitting: Lab

## 2011-08-01 ENCOUNTER — Other Ambulatory Visit: Payer: Self-pay | Admitting: Physician Assistant

## 2011-08-01 ENCOUNTER — Ambulatory Visit: Payer: 59

## 2011-08-01 ENCOUNTER — Encounter: Payer: Self-pay | Admitting: Oncology

## 2011-08-01 DIAGNOSIS — C50919 Malignant neoplasm of unspecified site of unspecified female breast: Secondary | ICD-10-CM

## 2011-08-01 DIAGNOSIS — C50912 Malignant neoplasm of unspecified site of left female breast: Secondary | ICD-10-CM

## 2011-08-01 LAB — COMPREHENSIVE METABOLIC PANEL
ALT: 17 U/L (ref 0–35)
AST: 19 U/L (ref 0–37)
Albumin: 3.9 g/dL (ref 3.5–5.2)
Alkaline Phosphatase: 130 U/L — ABNORMAL HIGH (ref 39–117)
BUN: 16 mg/dL (ref 6–23)
Calcium: 9.5 mg/dL (ref 8.4–10.5)
Chloride: 102 mEq/L (ref 96–112)
Potassium: 4 mEq/L (ref 3.5–5.3)
Sodium: 140 mEq/L (ref 135–145)
Total Protein: 7.8 g/dL (ref 6.0–8.3)

## 2011-08-01 LAB — CBC WITH DIFFERENTIAL/PLATELET
Basophils Absolute: 0 10*3/uL (ref 0.0–0.1)
EOS%: 2.5 % (ref 0.0–7.0)
HGB: 11.5 g/dL — ABNORMAL LOW (ref 11.6–15.9)
MCH: 28.7 pg (ref 25.1–34.0)
MONO%: 8.1 % (ref 0.0–14.0)
NEUT#: 3 10*3/uL (ref 1.5–6.5)
RBC: 4 10*6/uL (ref 3.70–5.45)
RDW: 14.2 % (ref 11.2–14.5)
lymph#: 1.9 10*3/uL (ref 0.9–3.3)

## 2011-08-01 NOTE — Progress Notes (Signed)
Patient came by office today, husband's job is ending this week so she will no longer have insurance, I gave patient an Programmer, systems and medicaid application.

## 2011-08-01 NOTE — Telephone Encounter (Signed)
S/w the pt and she is aware of the lab and the financial counseling appt for today

## 2011-08-03 LAB — SEDIMENTATION RATE: Sed Rate: 9 mm/hr (ref 0–22)

## 2011-08-31 ENCOUNTER — Other Ambulatory Visit: Payer: 59

## 2011-09-20 ENCOUNTER — Telehealth: Payer: Self-pay | Admitting: Oncology

## 2011-09-20 NOTE — Telephone Encounter (Signed)
Pt lmonvm asking to be called to confirm what appt for 5/20 is for. Pt states she did not know she has an appt. Pt also missed lb appt on 4/30. Lb was r/s for 5/20 b4 f/u. Returned pt call and lmonvm confirming appts for 5/20 @ 8:45 am for lb/AB. Pt to call crystal if she needs to r/s.

## 2011-09-24 ENCOUNTER — Other Ambulatory Visit: Payer: 59 | Admitting: Lab

## 2011-09-24 ENCOUNTER — Ambulatory Visit: Payer: 59 | Admitting: Physician Assistant

## 2011-09-25 ENCOUNTER — Other Ambulatory Visit: Payer: 59 | Admitting: Lab

## 2011-10-02 ENCOUNTER — Ambulatory Visit: Payer: 59 | Admitting: Oncology

## 2011-10-16 ENCOUNTER — Other Ambulatory Visit (HOSPITAL_BASED_OUTPATIENT_CLINIC_OR_DEPARTMENT_OTHER): Payer: Managed Care, Other (non HMO) | Admitting: Lab

## 2011-10-16 ENCOUNTER — Encounter: Payer: Self-pay | Admitting: Physician Assistant

## 2011-10-16 ENCOUNTER — Telehealth: Payer: Self-pay | Admitting: Oncology

## 2011-10-16 ENCOUNTER — Ambulatory Visit (HOSPITAL_BASED_OUTPATIENT_CLINIC_OR_DEPARTMENT_OTHER): Payer: Managed Care, Other (non HMO) | Admitting: Physician Assistant

## 2011-10-16 VITALS — BP 138/84 | HR 85 | Temp 98.3°F | Ht 64.5 in | Wt 190.6 lb

## 2011-10-16 DIAGNOSIS — C50912 Malignant neoplasm of unspecified site of left female breast: Secondary | ICD-10-CM

## 2011-10-16 DIAGNOSIS — M79641 Pain in right hand: Secondary | ICD-10-CM | POA: Insufficient documentation

## 2011-10-16 DIAGNOSIS — Z23 Encounter for immunization: Secondary | ICD-10-CM

## 2011-10-16 DIAGNOSIS — Z170 Estrogen receptor positive status [ER+]: Secondary | ICD-10-CM

## 2011-10-16 DIAGNOSIS — I890 Lymphedema, not elsewhere classified: Secondary | ICD-10-CM | POA: Insufficient documentation

## 2011-10-16 DIAGNOSIS — M79642 Pain in left hand: Secondary | ICD-10-CM

## 2011-10-16 DIAGNOSIS — C50919 Malignant neoplasm of unspecified site of unspecified female breast: Secondary | ICD-10-CM

## 2011-10-16 DIAGNOSIS — M79609 Pain in unspecified limb: Secondary | ICD-10-CM

## 2011-10-16 LAB — CBC WITH DIFFERENTIAL/PLATELET
BASO%: 0.2 % (ref 0.0–2.0)
Basophils Absolute: 0 10*3/uL (ref 0.0–0.1)
EOS%: 2.4 % (ref 0.0–7.0)
HCT: 36 % (ref 34.8–46.6)
HGB: 11.7 g/dL (ref 11.6–15.9)
LYMPH%: 34.7 % (ref 14.0–49.7)
MCH: 27.7 pg (ref 25.1–34.0)
MCHC: 32.5 g/dL (ref 31.5–36.0)
MCV: 85.3 fL (ref 79.5–101.0)
MONO%: 5.2 % (ref 0.0–14.0)
NEUT%: 57.5 % (ref 38.4–76.8)
Platelets: 191 10*3/uL (ref 145–400)

## 2011-10-16 LAB — COMPREHENSIVE METABOLIC PANEL
ALT: 14 U/L (ref 0–35)
AST: 16 U/L (ref 0–37)
BUN: 12 mg/dL (ref 6–23)
Calcium: 9.1 mg/dL (ref 8.4–10.5)
Chloride: 104 mEq/L (ref 96–112)
Creatinine, Ser: 0.83 mg/dL (ref 0.50–1.10)
Total Bilirubin: 0.4 mg/dL (ref 0.3–1.2)

## 2011-10-16 LAB — CANCER ANTIGEN 27.29: CA 27.29: 22 U/mL (ref 0–39)

## 2011-10-16 NOTE — Telephone Encounter (Signed)
gve the pt her sept 2013 appt calendar along with the appt to see dr sypher and the lymphedema clinic

## 2011-10-16 NOTE — Progress Notes (Signed)
ID: Era Skeen   DOB: [birth year]  MR#: [id number]  CSN#:[id number]  HISTORY OF PRESENT ILLNESS: The patient had her routine screening mammogram on 10/22/2007 which showed a possible mass in the left breast. A left diagnostic mammogram and ultrasound on 11/10/2007 with spot compression views showed a 2.5 cm mass in the upper left breast posteriorly, also palpable. All ultrasound confirmed a 2.6 the regular hypoechoic lesion with an adjacent 1 cm angulated satellite lesion. Biopsy on 11/10/2007 [phone] and [id number]) showed an invasive ductal carcinoma, ER positive at 87%, PR positive at 12%, HER-2/neu negative, with a very low proliferation fraction of 3%.  Bilateral breast MRIs were obtained 11/18/2007, again noting a 2.3 cm oval mass in the left breast with spiculated margins and a satellite nodule immediately anterior to this. Together, both masses measured 3.4 cm. No abnormality noted in the right breast.  Patient underwent left lumpectomy and sentinel lymph node sampling on 12/08/2007 under the care of Dr. Jamey Ripa, with final pathology [phone]) confirming a 3.8 cm invasive ductal carcinoma, grade 2 with 2 of 4 sentinel lymph nodes involved. 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  Patient underwent additional surgery for clear margin in September 2009. Consisted of a left partial mastectomy with removal of the areolar complex. Axillary node dissection was also performed, an additional 6 lymph nodes evaluated, one of which was involved with metastatic ductal carcinoma. At the time of surgery, patient also underwent total abdominal hysterectomy and bilateral salpingo-oophorectomy with benign pathology. [phone])  The patient underwent radiation therapy, after which she began on tamoxifen in March of 2010. Tamoxifen was continued until August of 2012 at which time the patient switched to letrozole. Letrozole was then discontinued in February 2013 due to  arthralgias and myalgias. After a three-month "wash out" period,the  patient started back on tamoxifen in June of 2013.  INTERVAL HISTORY: Linda Kramer returns today for routine followup of her left breast carcinoma. 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 She and her family are planning a trip to [facility] the summer.  Interval history is remarkable for Linda Kramer having gone back to tamoxifen beginning June 1. Over the last 3 months after discontinuing letrozole in February, she has noticed definite improvement in her bony pain and her joint pain. Her joints still bother her at times, but it is definitely improved. Her hair is still thinning, but is less than it was on the letrozole.  Linda Kramer is concerned about pain in her hands and wrists bilaterally. This sometimes hurts, sometimes limits her activities, and often feels numb in the mornings. The right hand seems to be more affected than the left. On the left side, she also has some mild lymphedema.  REVIEW OF SYSTEMS: Otherwise, Linda Kramer has had no recent illnesses and denies fevers or chills. She has hot flashes which are somewhat problematic, and she admits that she never began the gabapentin previously prescribed. She's had no vaginal dryness, discharge, or bleeding. No increased peripheral swelling or evidence of abnormal clotting. No abnormal bleeding. No change in vision. No nausea or change in bowel habits. No cough or increased shortness of breath. No chest pain, and no abnormal headaches.  A detailed review of systems is otherwise noncontributory.  PAST MEDICAL HISTORY: Past Medical History  Diagnosis Date  . Breast cancer, left breast 04/18/2011  Significant for fibroids, varicose veins, anemia, endometriosis and wisdom teeth removal.  PAST  SURGICAL HISTORY: No past surgical history on file.  FAMILY HISTORY No family history on file. The patient's father died from a heart  attack at age 45. The patient's mother is alive and in her 4s. The patient is one of 8 siblings, 5 girls and 3 boys. One sister died in an automobile accident remotely. There is no known breast or ovarian cancer in the family  GYNECOLOGIC HISTORY:  GX P0, status post total abdominal hysterectomy and bilateral salpingo-oophorectomy in August 2009.   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 Her husband, Nira Retort, is an Dietitian. They have 2 adopted daughters, [place] and [place]. The patient is a Control and instrumentation engineer.    ADVANCED DIRECTIVES:  HEALTH MAINTENANCE: History  Substance Use Topics  . Smoking status: Never Smoker   . Smokeless tobacco: Never Used  . Alcohol Use: 0.6 oz/week    1 Glasses of wine per week     Colonoscopy: 2012  PAP: UTD  Bone density: never  Lipid panel: Dr. Laurann Montana  No Known Allergies  Current Outpatient Prescriptions  Medication Sig Dispense Refill  . calcium & magnesium carbonates (MYLANTA) 311-232 MG per tablet Take 1 tablet by mouth daily.        . cholecalciferol (VITAMIN D) 400 UNITS TABS Take 2,000 Units by mouth daily.        . citalopram (CELEXA) 40 MG tablet Take 1 tablet (40 mg total) by mouth daily.  90 tablet  0  . fish oil-omega-3 fatty acids 1000 MG capsule Take 1 g by mouth daily.        Marland Kitchen ibuprofen (ADVIL,MOTRIN) 200 MG tablet Take 200 mg by mouth every 6 (six) hours as needed.        . Multiple Vitamin (MULTIVITAMIN) tablet Take 1 tablet by mouth daily.        . naproxen (NAPROSYN) 500 MG tablet Take 1 tablet (500 mg total) by mouth 3 (three) times daily with meals as needed.  60 tablet  1  . tamoxifen (NOLVADEX) 20 MG tablet Take 20 mg by mouth daily.      Marland Kitchen gabapentin (NEURONTIN) 300 MG capsule Take 1 capsule (300 mg total) by mouth at bedtime as needed (hot flashes and post surgical pain).  30 capsule  6    OBJECTIVE: Middle-aged [place] American female who appears  comfortable and in no acute distress. Filed Vitals:   10/16/11 1320  BP: 138/84  Pulse: 85  Temp: 98.3 F (36.8 C)     Body mass index is 32.21 kg/(m^2).    ECOG FS: 0  Filed Weights   10/16/11 1320  Weight: 190 lb 9.6 oz (86.456 kg)   Physical Exam: HEENT:  Sclerae anicteric, conjunctivae pink.  Oropharynx clear.   Nodes:  No cervical, supraclavicular, or axillary lymphadenopathy palpated.  Breast Exam:  Right breast is benign no masses, skin changes, or nipple inversion. Left breast is status post lumpectomy with the removal of the areolar complex. No suspicious nodularities or skin changes, and no evidence of local recurrence.   Lungs:  Clear to auscultation bilaterally.  No crackles, rhonchi, or wheezes.   Heart:  Regular rate and rhythm.   Abdomen:  Soft, nontender.  Positive bowel sounds.  No organomegaly or masses palpated.   Musculoskeletal:  Positive Phalens test bilaterally.  Positive Finkelstein test on right. No focal spinal tenderness to palpation.  Extremities:  Mild lymphedema noted in the left upper extremity, nonpitting.  No additional peripheral edema or cyanosis.   Skin:  Benign.   Neuro:  Nonfocal. Alert and oriented x3.    LAB RESULTS: Lab Results  Component Value Date   WBC 4.6 10/16/2011   NEUTROABS 2.7 10/16/2011   HGB 11.7 10/16/2011   HCT 36.0 10/16/2011   MCV 85.3 10/16/2011   PLT 191 10/16/2011      Chemistry      Component Value Date/Time   NA 140 08/01/2011 1535   K 4.0 08/01/2011 1535   CL 102 08/01/2011 1535   CO2 30 08/01/2011 1535   BUN 16 08/01/2011 1535   CREATININE 0.84 08/01/2011 1535      Component Value Date/Time   CALCIUM 9.5 08/01/2011 1535   ALKPHOS 130* 08/01/2011 1535   AST 19 08/01/2011 1535   ALT 17 08/01/2011 1535   BILITOT 0.1* 08/01/2011 1535       Lab Results  Component Value Date   LABCA2 18 04/11/2011   LABCA2 18 04/11/2011    STUDIES: No results found.  Next mammogram is due in August 2013.  ASSESSMENT: 51 y.o. BRCA  1-2 negative [place] woman  1. Status post left lumpectomy and sentinel lymph node biopsy in August 2009 for a T2 N1, stage IIB, invasive ductal carcinoma, grade 2, estrogen receptor 87% positive, progesterone receptor 12% positive, with an MIB-1 of 3% and no HER-2 amplification  2. 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  3. Received radiation therapy, completed March 2010  4. Started on tamoxifen in March of 2010 and continued until August of 2012.  5. Began on letrozole in August 2012, discontinued February 2013 because of arthralgias/myalgias.  Began on tamoxifen in June 2013. 6. Status post TAH/BSO in August 2009 with benign pathology   PLAN: Elanna had many concerns and questions today, and over half of our 45 minute appointment was spent reviewing her concerns, answering questions, and coordinating care. She will continue on tamoxifen and we will reassess her tolerance in 3 months when she sees me in September. Prior to that appointment, she scheduled for her annual mammogram in August.  I have also given Jadea a new prescription for gabapentin to take for hot flashes, 300 mg by mouth at bedtime.  In the meanwhile, I am also referring her for further evaluation with Dr. Teressa Senter regarding her pain and numbness bilaterally in the wrists and hands. I feel like this is either carpal tunnel or de Quervains tendinitis, or perhaps some of both. The right hand does seem to be more affected.  She's also being referred back to the lymphedema clinic for further evaluation of lymphedema in the left upper extremity.  All of this was reviewed with Linda Kramer and she voices understanding and agreement with our plan today. She will call with any changes or problems.  Davidson Palmieri    10/16/2011

## 2011-10-17 ENCOUNTER — Encounter: Payer: Self-pay | Admitting: Physician Assistant

## 2011-10-17 NOTE — Progress Notes (Signed)
Spoke with patient by phone this morning regarding lab results drawn on 10/16/2011. Patient's nonfasting glucose was elevated at 192. Per patient request, I am faxing these lab results to her PCP, Dr. Laurann Montana. I have requested she call Dr. Lucilla Lame office to schedule a followup appointment for additional labs at your discretion.  Patient voiced understanding and agreement.  Zollie Scale, PA-C 10/17/2011

## 2011-10-18 ENCOUNTER — Encounter (INDEPENDENT_AMBULATORY_CARE_PROVIDER_SITE_OTHER): Payer: Self-pay | Admitting: General Surgery

## 2011-10-19 ENCOUNTER — Encounter (INDEPENDENT_AMBULATORY_CARE_PROVIDER_SITE_OTHER): Payer: Self-pay | Admitting: Surgery

## 2011-10-19 ENCOUNTER — Ambulatory Visit (INDEPENDENT_AMBULATORY_CARE_PROVIDER_SITE_OTHER): Payer: Managed Care, Other (non HMO) | Admitting: Surgery

## 2011-10-19 VITALS — BP 126/80 | HR 80 | Resp 14 | Ht 64.0 in | Wt 192.0 lb

## 2011-10-19 DIAGNOSIS — Z853 Personal history of malignant neoplasm of breast: Secondary | ICD-10-CM

## 2011-10-19 NOTE — Patient Instructions (Signed)
See me again in a year. Have your mammogram report in August sent to me.

## 2011-10-19 NOTE — Progress Notes (Signed)
NAME: Linda Kramer       DOB: 1961-03-14           DATE: 10/19/2011       MRN: [id number]   RANELLE AUKER is a 51 y.o.Marland Kitchenfemale who presents for routine followup of her Left IDC diagnosed in 2009 and treated with lumpectomy, ax diss., chemo, radiation. She has no problems or concerns on either side.  PFSH: She has had no significant changes since the last visit here.  ROS: There have been no significant changes since the last visit here  EXAM:  VS: BP 126/80  Pulse 80  Resp 14  Ht 5\' 4"  (1.626 m)  Wt 192 lb (87.091 kg)  BMI 32.96 kg/m2   General: The patient is alert, oriented, generally healthy appearing, NAD. Mood and affect are normal.  Breasts:  Right is s/p rfeduction. Left s/p central partial mastectomy. No evidence of recurrence. Left is smaller than the right  Lymphatics: She has no axillary or supraclavicular adenopathy on either side.  Extremities: Full ROM of the surgical side with no lymphedema noted.  Data Reviewed: Mammogram in August negative.   Impression: Doing well, with no evidence of recurrent cancer or new cancer  Plan: Will continue to follow up on an annual basis here.

## 2011-10-31 ENCOUNTER — Ambulatory Visit: Payer: Managed Care, Other (non HMO) | Attending: Physician Assistant | Admitting: Physical Therapy

## 2011-10-31 DIAGNOSIS — I890 Lymphedema, not elsewhere classified: Secondary | ICD-10-CM | POA: Insufficient documentation

## 2011-10-31 DIAGNOSIS — IMO0001 Reserved for inherently not codable concepts without codable children: Secondary | ICD-10-CM | POA: Insufficient documentation

## 2011-12-31 ENCOUNTER — Ambulatory Visit
Admission: RE | Admit: 2011-12-31 | Discharge: 2011-12-31 | Disposition: A | Discharge status: Home / Self Care | Payer: Managed Care, Other (non HMO) | Source: Ambulatory Visit | Attending: Physician Assistant | Admitting: Physician Assistant

## 2011-12-31 DIAGNOSIS — C50919 Malignant neoplasm of unspecified site of unspecified female breast: Secondary | ICD-10-CM

## 2012-01-01 ENCOUNTER — Other Ambulatory Visit: Payer: Self-pay | Admitting: Physician Assistant

## 2012-01-01 ENCOUNTER — Telehealth: Payer: Self-pay | Admitting: Medical Oncology

## 2012-01-01 DIAGNOSIS — C50912 Malignant neoplasm of unspecified site of left female breast: Secondary | ICD-10-CM

## 2012-01-01 NOTE — Telephone Encounter (Signed)
LMOVM for patient to return call to clinic

## 2012-01-16 ENCOUNTER — Other Ambulatory Visit: Payer: Managed Care, Other (non HMO) | Admitting: Lab

## 2012-01-16 ENCOUNTER — Other Ambulatory Visit: Payer: Self-pay | Admitting: Oncology

## 2012-01-16 DIAGNOSIS — C50919 Malignant neoplasm of unspecified site of unspecified female breast: Secondary | ICD-10-CM

## 2012-01-16 DIAGNOSIS — C50912 Malignant neoplasm of unspecified site of left female breast: Secondary | ICD-10-CM

## 2012-01-16 LAB — COMPREHENSIVE METABOLIC PANEL (CC13)
Albumin: 3.6 g/dL (ref 3.5–5.0)
BUN: 18 mg/dL (ref 7.0–26.0)
CO2: 24 mEq/L (ref 22–29)
Calcium: 9.1 mg/dL (ref 8.4–10.4)
Chloride: 107 mEq/L (ref 98–107)
Glucose: 128 mg/dl — ABNORMAL HIGH (ref 70–99)
Potassium: 4.1 mEq/L (ref 3.5–5.1)
Total Protein: 7.2 g/dL (ref 6.4–8.3)

## 2012-01-16 LAB — CBC WITH DIFFERENTIAL/PLATELET
Basophils Absolute: 0 10*3/uL (ref 0.0–0.1)
Eosinophils Absolute: 0.1 10*3/uL (ref 0.0–0.5)
HGB: 11.1 g/dL — ABNORMAL LOW (ref 11.6–15.9)
MCV: 86.4 fL (ref 79.5–101.0)
MONO#: 0.3 10*3/uL (ref 0.1–0.9)
MONO%: 6.2 % (ref 0.0–14.0)
NEUT#: 2.7 10*3/uL (ref 1.5–6.5)
RDW: 13.9 % (ref 11.2–14.5)
WBC: 5.2 10*3/uL (ref 3.9–10.3)
lymph#: 2.1 10*3/uL (ref 0.9–3.3)

## 2012-01-23 ENCOUNTER — Encounter: Payer: Self-pay | Admitting: Physician Assistant

## 2012-01-23 ENCOUNTER — Ambulatory Visit (HOSPITAL_BASED_OUTPATIENT_CLINIC_OR_DEPARTMENT_OTHER): Payer: Managed Care, Other (non HMO) | Admitting: Physician Assistant

## 2012-01-23 ENCOUNTER — Telehealth: Payer: Self-pay | Admitting: *Deleted

## 2012-01-23 VITALS — BP 135/83 | HR 86 | Temp 98.7°F | Resp 20 | Ht 64.0 in | Wt 188.2 lb

## 2012-01-23 DIAGNOSIS — Z170 Estrogen receptor positive status [ER+]: Secondary | ICD-10-CM

## 2012-01-23 DIAGNOSIS — R52 Pain, unspecified: Secondary | ICD-10-CM

## 2012-01-23 DIAGNOSIS — C50919 Malignant neoplasm of unspecified site of unspecified female breast: Secondary | ICD-10-CM

## 2012-01-23 DIAGNOSIS — C50912 Malignant neoplasm of unspecified site of left female breast: Secondary | ICD-10-CM

## 2012-01-23 MED ORDER — TAMOXIFEN CITRATE 20 MG PO TABS: 20.0000 mg | ORAL_TABLET | Freq: Every day | ORAL | Status: DC

## 2012-01-23 MED ORDER — GABAPENTIN 300 MG PO CAPS: 300.0000 mg | ORAL_CAPSULE | Freq: Every evening | ORAL | Status: DC | PRN

## 2012-01-23 NOTE — Patient Instructions (Signed)
Continue tamoxifen, 20 mg daily.  Continue gabapentin, 300 mg at night, for hot flashes and post-surgical pain.  Recommend podiatrist or PCP for further evaluation of right great toe.  Breast MRI to be scheduled.  Return for labs and follow up visit in 6 months, March 2014.

## 2012-01-23 NOTE — Progress Notes (Signed)
ID: Linda Kramer   DOB: 15-Jun-1960  MR#: [id number]  WUJ#:[id number]  HISTORY OF PRESENT ILLNESS: The patient had her routine screening mammogram on 10/22/2007 which showed a possible mass in the left breast. A left diagnostic mammogram and ultrasound on 11/10/2007 with spot compression views showed a 2.5 cm mass in the upper left breast posteriorly, also palpable. All ultrasound confirmed a 2.6 the regular hypoechoic lesion with an adjacent 1 cm angulated satellite lesion. Biopsy on 11/10/2007 [phone] and [id number]) showed an invasive ductal carcinoma, ER positive at 87%, PR positive at 12%, HER-2/neu negative, with a very low proliferation fraction of 3%.  Bilateral breast MRIs were obtained 11/18/2007, again noting a 2.3 cm oval mass in the left breast with spiculated margins and a satellite nodule immediately anterior to this. Together, both masses measured 3.4 cm. No abnormality noted in the right breast.  Patient underwent left lumpectomy and sentinel lymph node sampling on 12/08/2007 under the care of Dr. Jamey Ripa, with final pathology [id number]) confirming a 3.8 cm invasive ductal carcinoma, grade 2 with 2 of 4 sentinel lymph nodes involved. 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  Patient underwent additional surgery for clear margin in September 2009. Consisted of a left partial mastectomy with removal of the areolar complex. Axillary node dissection was also performed, an additional 6 lymph nodes evaluated, one of which was involved with metastatic ductal carcinoma. At the time of surgery, patient also underwent total abdominal hysterectomy and bilateral salpingo-oophorectomy with benign pathology. [phone])  The patient underwent radiation therapy, after which she began on tamoxifen in March of 2010. Tamoxifen was continued until August of 2012 at which time the patient switched to letrozole. Letrozole was then discontinued in February 2013 due to  arthralgias and myalgias. After a three-month "wash out" period,the  patient started back on tamoxifen in June of 2013.  INTERVAL HISTORY: Linda Kramer returns today for routine followup of her left breast carcinoma. 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  She had a good but busy summer with her family. Her daughters are doing well in school.  Linda Kramer continues on tamoxifen which she is tolerating well. She is on gabapentin at night which has helped significantly with her hot flashes.  She saw Dr. Teressa Senter after our last visit with regards to hand and wrist pain bilaterally. She tells me she received an injection in her right wrist which did relieve some of the discomfort. This has worsened again, however, since returning to work this fall.  REVIEW OF SYSTEMS: Otherwise, Linda Kramer has had no recent illnesses and denies fevers or chills.  She's had no vaginal dryness, discharge, or bleeding. No increased peripheral swelling or evidence of abnormal clotting. No abnormal bleeding. No change in vision, other than the fact that she uses reading glasses nail. She has occasional reflux and heartburn. No nausea or change in bowel or bladder habits. No cough or increased shortness of breath. No chest pain, and no abnormal headaches. No unusual myalgias or arthralgias. 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 She also notes some pain around the nail of the right great toe.  A detailed review of systems is otherwise noncontributory.  PAST MEDICAL HISTORY: Past Medical History  Diagnosis Date  . Breast cancer, left breast 04/18/2011  . History of blood transfusion   Significant for fibroids, varicose veins, anemia, endometriosis and wisdom teeth removal.  PAST SURGICAL HISTORY: Past Surgical History  Procedure Date  .  Breast lumpectomy 01/20/2008    left  . Breast reduction surgery     right    FAMILY HISTORY No family history on file. The patient's father died from a  heart attack at age 27. The patient's mother is alive and in her 37s. The patient is one of 8 siblings, 5 girls and 3 boys. One sister died in an automobile accident remotely. There is no known breast or ovarian cancer in the family  GYNECOLOGIC HISTORY:  GX P0, status post total abdominal hysterectomy and bilateral salpingo-oophorectomy in August 2009.   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 Her husband, Nira Retort, is an Dietitian. They have 2 adopted daughters, [place] and [place]. The patient is a Control and instrumentation engineer.    ADVANCED DIRECTIVES:  HEALTH MAINTENANCE: History  Substance Use Topics  . Smoking status: Never Smoker   . Smokeless tobacco: Never Used  . Alcohol Use: 0.6 oz/week    1 Glasses of wine per week     Colonoscopy: 2012  PAP: UTD  Bone density: never  Lipid panel: Dr. Laurann Montana  No Known Allergies  Current Outpatient Prescriptions  Medication Sig Dispense Refill  . calcium carbonate 1250 MG capsule Take 1,250 mg by mouth 2 (two) times daily with a meal.      . cholecalciferol (VITAMIN D) 400 UNITS TABS Take 2,000 Units by mouth daily.        . citalopram (CELEXA) 40 MG tablet Take 1 tablet (40 mg total) by mouth daily.  90 tablet  0  . fish oil-omega-3 fatty acids 1000 MG capsule Take 1 g by mouth daily.        Marland Kitchen gabapentin (NEURONTIN) 300 MG capsule Take 1 capsule (300 mg total) by mouth at bedtime as needed (hot flashes and post surgical pain).  30 capsule  11  . ibuprofen (ADVIL,MOTRIN) 200 MG tablet Take 200 mg by mouth every 6 (six) hours as needed.        . Multiple Vitamin (MULTIVITAMIN) tablet Take 1 tablet by mouth daily.        . naproxen (NAPROSYN) 500 MG tablet Take 1 tablet (500 mg total) by mouth 3 (three) times daily with meals as needed.  60 tablet  1  . tamoxifen (NOLVADEX) 20 MG tablet Take 1 tablet (20 mg total) by mouth daily.  30 tablet  11  . DISCONTD: gabapentin (NEURONTIN)  300 MG capsule Take 1 capsule (300 mg total) by mouth at bedtime as needed (hot flashes and post surgical pain).  30 capsule  6    OBJECTIVE: Middle-aged [place] American female who appears comfortable and in no acute distress. ECOG FS: 0 Filed Vitals:   01/23/12 1605  BP: 135/83  Pulse: 86  Temp: 98.7 F (37.1 C)  TempSrc: Oral  Resp: 20  Height: 5\' 4"  (1.626 m)  Weight: 188 lb 3.2 oz (85.367 kg)  Body mass index is 32.30 kg/(m^2).  Physical Exam: HEENT:  Sclerae anicteric.  Oropharynx clear.   Nodes:  No cervical, supraclavicular, or axillary lymphadenopathy palpated.  Breast Exam:  Right breast is benign no masses, skin changes, or nipple inversion. Left breast is status post lumpectomy with the removal of the areolar complex. No suspicious nodularities or skin changes, and no evidence of local recurrence.   Lungs:  Clear to auscultation bilaterally.   Heart:  Regular rate and rhythm.   Abdomen:  Soft, nontender.  Positive bowel sounds.  Musculoskeletal: No  focal spinal tenderness to palpation.  Extremities:  Mild lymphedema noted in the left upper extremity, nonpitting.  No additional peripheral edema. Neuro:  Nonfocal. Alert and oriented x3.    LAB RESULTS: Lab Results  Component Value Date   WBC 5.2 01/16/2012   NEUTROABS 2.7 01/16/2012   HGB 11.1* 01/16/2012   HCT 35.0 01/16/2012   MCV 86.4 01/16/2012   PLT 185 01/16/2012      Chemistry      Component Value Date/Time   NA 141 01/16/2012 1622   NA 141 10/16/2011 1310   K 4.1 01/16/2012 1622   K 4.0 10/16/2011 1310   CL 107 01/16/2012 1622   CL 104 10/16/2011 1310   CO2 24 01/16/2012 1622   CO2 27 10/16/2011 1310   BUN 18.0 01/16/2012 1622   BUN 12 10/16/2011 1310   CREATININE 0.9 01/16/2012 1622   CREATININE 0.83 10/16/2011 1310      Component Value Date/Time   CALCIUM 9.1 01/16/2012 1622   CALCIUM 9.1 10/16/2011 1310   ALKPHOS 125 01/16/2012 1622   ALKPHOS 125* 10/16/2011 1310   AST 16 01/16/2012 1622   AST 16 10/16/2011  1310   ALT 14 01/16/2012 1622   ALT 14 10/16/2011 1310   BILITOT 0.20 01/16/2012 1622   BILITOT 0.4 10/16/2011 1310       Lab Results  Component Value Date   LABCA2 23 01/16/2012    STUDIES: Mm Digital Diagnostic Bilat  12/31/2011  *RADIOLOGY REPORT*  Clinical Data:  Patient presents for a bilateral diagnostic mammogram due to a history of a prior left malignant lumpectomy 2009.  Prior right reduction mammoplasty 2010.  DIGITAL DIAGNOSTIC BILATERAL MAMMOGRAM WITH CAD  Comparison:  12/21/2010, 11/21/2009, 11/18/2008 and 10/22/2007  Findings:  Exam demonstrates scattered fibroglandular densities. There are post reduction changes of the right breast.  There are post lumpectomy changes of the left breast which are incompletely visualized despite additional views.  The lumpectomy site is very deep and difficult to get into the field of view. Remainder of the exam is unchanged. Mammographic images were processed with CAD.  IMPRESSION: Stable mammogram with post reduction changes of the right breast and post lumpectomy changes of the left breast.  Lumpectomy site is difficult to completely image due to its deep location.  RECOMMENDATION: Recommend breast MRI for further evaluation for better assessment of the left lumpectomy site.  The patient's last breast MRI was 2011.  Otherwise recommend continued annual bilateral diagnostic mammographic evaluation.  BI-RADS CATEGORY 0:  Incomplete.  Need additional imaging evaluation and/or prior mammograms for comparison.   Original Report Authenticated By: Elba Barman, M.D.     ASSESSMENT: 51 y.o. BRCA 1-2 negative [place] woman  1. Status post left lumpectomy and sentinel lymph node biopsy in August 2009 for a T2 N1, stage IIB, invasive ductal carcinoma, grade 2, estrogen receptor 87% positive, progesterone receptor 12% positive, with an MIB-1 of 3% and no HER-2 amplification  2. Received adjuvant chemotherapy with 4 dose dense cycles of doxorubicin and  cyclophosphamide followed by weekly paclitaxel x5, discontinued due to neuropathy.  3. Received radiation therapy, completed March 2010  4. Started on tamoxifen in March of 2010 and continued until August of 2012.  5. Began on letrozole in August 2012, discontinued February 2013 because of arthralgias/myalgias.  Began on tamoxifen in June 2013. 6. Status post TAH/BSO in August 2009 with benign pathology   PLAN: Annete appears to be doing well with regards to her breast cancer, with  no clinical evidence of disease recurrence at this time. She will continue on tamoxifen, 20 mg daily, and we'll also continue on gabapentin which seems to be helping with both hot flashes and postsurgical pain. I have refilled both of these medications today. We are ordering a breast MRI for further evaluation of the left lumpectomy site as recommended by the recent mammogram report.  I have recommended that Yarianna continue to followup with her primary care physician, Dr. Cliffton Asters, specifically with regards to some reflux, and some pain in her right great toenail. in August.  We also discussed trying vitamin B complex daily for some residual neuropathy in her toes.  Timiyah will return for routine followup in 6 months. If she is doing well at that time, we will likely go to annual visits.  All of this was reviewed with Drinda Butts and she voices understanding and agreement with our plan today. She will call with any changes or problems.  Jacobs Golab    01/23/2012

## 2012-01-23 NOTE — Telephone Encounter (Signed)
Gave patient appointment for 07-09-2012 at 2:30pm  07-16-2012 at 3:00pm   Will call Sheralyn Boatman back on 01-28-2012 to set up mri of the breast

## 2012-01-23 NOTE — Telephone Encounter (Signed)
Na

## 2012-02-07 ENCOUNTER — Ambulatory Visit (HOSPITAL_COMMUNITY)
Admission: RE | Admit: 2012-02-07 | Discharge: 2012-02-07 | Disposition: A | Discharge status: Home / Self Care | Payer: Managed Care, Other (non HMO) | Source: Ambulatory Visit | Attending: Physician Assistant | Admitting: Physician Assistant

## 2012-02-07 DIAGNOSIS — C50912 Malignant neoplasm of unspecified site of left female breast: Secondary | ICD-10-CM

## 2012-02-07 DIAGNOSIS — C50919 Malignant neoplasm of unspecified site of unspecified female breast: Secondary | ICD-10-CM | POA: Insufficient documentation

## 2012-02-07 DIAGNOSIS — Z853 Personal history of malignant neoplasm of breast: Secondary | ICD-10-CM | POA: Insufficient documentation

## 2012-02-07 MED ORDER — GADOBENATE DIMEGLUMINE 529 MG/ML IV SOLN
20.0000 mL | Freq: Once | INTRAVENOUS | Status: AC | PRN
  Administered 2012-02-07: 18 mL via INTRAVENOUS

## 2012-07-09 ENCOUNTER — Other Ambulatory Visit (HOSPITAL_BASED_OUTPATIENT_CLINIC_OR_DEPARTMENT_OTHER): Payer: Managed Care, Other (non HMO) | Admitting: Lab

## 2012-07-09 DIAGNOSIS — C50912 Malignant neoplasm of unspecified site of left female breast: Secondary | ICD-10-CM

## 2012-07-09 DIAGNOSIS — C50919 Malignant neoplasm of unspecified site of unspecified female breast: Secondary | ICD-10-CM

## 2012-07-09 LAB — CBC WITH DIFFERENTIAL/PLATELET
BASO%: 0.5 % (ref 0.0–2.0)
LYMPH%: 34.4 % (ref 14.0–49.7)
MCHC: 33.1 g/dL (ref 31.5–36.0)
MONO#: 0.4 10*3/uL (ref 0.1–0.9)
NEUT#: 3.4 10*3/uL (ref 1.5–6.5)
Platelets: 177 10*3/uL (ref 145–400)
RBC: 3.94 10*6/uL (ref 3.70–5.45)
RDW: 14 % (ref 11.2–14.5)
WBC: 6 10*3/uL (ref 3.9–10.3)

## 2012-07-09 LAB — COMPREHENSIVE METABOLIC PANEL (CC13)
ALT: 14 U/L (ref 0–55)
AST: 17 U/L (ref 5–34)
Calcium: 9.2 mg/dL (ref 8.4–10.4)
Chloride: 103 mEq/L (ref 98–107)
Creatinine: 0.9 mg/dL (ref 0.6–1.1)
Sodium: 140 mEq/L (ref 136–145)
Total Bilirubin: <0.2 mg/dL (ref 0.20–1.20)
Total Protein: 7.3 g/dL (ref 6.4–8.3)

## 2012-07-09 LAB — CANCER ANTIGEN 27.29: CA 27.29: 23 U/mL (ref 0–39)

## 2012-07-16 ENCOUNTER — Telehealth: Payer: Self-pay | Admitting: Oncology

## 2012-07-16 ENCOUNTER — Ambulatory Visit (HOSPITAL_BASED_OUTPATIENT_CLINIC_OR_DEPARTMENT_OTHER): Payer: Managed Care, Other (non HMO) | Admitting: Oncology

## 2012-07-16 VITALS — BP 158/91 | HR 81 | Temp 97.6°F | Resp 20 | Ht 64.0 in | Wt 191.3 lb

## 2012-07-16 DIAGNOSIS — Z170 Estrogen receptor positive status [ER+]: Secondary | ICD-10-CM

## 2012-07-16 DIAGNOSIS — C50912 Malignant neoplasm of unspecified site of left female breast: Secondary | ICD-10-CM

## 2012-07-16 DIAGNOSIS — C50919 Malignant neoplasm of unspecified site of unspecified female breast: Secondary | ICD-10-CM

## 2012-07-16 MED ORDER — TAMOXIFEN CITRATE 20 MG PO TABS: 20.0000 mg | ORAL_TABLET | Freq: Every day | ORAL | Status: DC

## 2012-07-16 NOTE — Progress Notes (Signed)
ID: STEPHANIEANN POPESCU   DOB: [birth year]  MR#: [id number]  QMV#:[id number]  PCP: Cala Bradford, MD GYN: SUCicero Duck OTHER MD:   HISTORY OF PRESENT ILLNESS: The patient had her routine screening mammogram on 10/22/2007 which showed a possible mass in the left breast. A left diagnostic mammogram and ultrasound on 11/10/2007 with spot compression views showed a 2.5 cm mass in the upper left breast posteriorly, also palpable. All ultrasound confirmed a 2.6 the regular hypoechoic lesion with an adjacent 1 cm angulated satellite lesion. Biopsy on 11/10/2007 [phone] and [id number]) showed an invasive ductal carcinoma, ER positive at 87%, PR positive at 12%, HER-2/neu negative, with a very low proliferation fraction of 3%.  Bilateral breast MRIs were obtained 11/18/2007, again noting a 2.3 cm oval mass in the left breast with spiculated margins and a satellite nodule immediately anterior to this. Together, both masses measured 3.4 cm. No abnormality noted in the right breast.  Patient underwent left lumpectomy and sentinel lymph node sampling on 12/08/2007 under the care of Dr. Jamey Ripa, with final pathology [id number]) confirming a 3.8 cm invasive ductal carcinoma, grade 2 with 2 of 4 sentinel lymph nodes involved. 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  Patient underwent additional surgery for clear margin in September 2009. Consisted of a left partial mastectomy with removal of the areolar complex. Axillary node dissection was also performed, an additional 6 lymph nodes evaluated, one of which was involved with metastatic ductal carcinoma. At the time of surgery, patient also underwent total abdominal hysterectomy and bilateral salpingo-oophorectomy with benign pathology. [phone])  The patient underwent radiation therapy, after which she began on tamoxifen in March of 2010. Tamoxifen was continued until August of 2012 at which time the patient switched to  letrozole. Letrozole was then discontinued in February 2013 due to arthralgias and myalgias. After a three-month "wash out" period,the  patient started back on tamoxifen in June of 2013.  INTERVAL HISTORY: Joseline returns today for followup of her left breast carcinoma. The interval history is unremarkable. 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  Her daughters are doing well in school.   REVIEW OF SYSTEMS: She is tolerating the tamoxifen well, with mild hot flashes and minimal vaginal wetness. She sometimes has leg cramps at night, complains of blurred vision at times, has mild sinus symptoms, gets short of breath when walking up stairs, without any chest pain, pressure, or palpitations, has some problems with heartburn, and occasionally has joint aches and pains which are not even persistent or intense. A detailed review of systems today was otherwise noncontributory.  PAST MEDICAL HISTORY: Past Medical History  Diagnosis Date  . Breast cancer, left breast 04/18/2011  . History of blood transfusion   Significant for fibroids, varicose veins, anemia, endometriosis and wisdom teeth removal.  PAST SURGICAL HISTORY: Past Surgical History  Procedure Laterality Date  . Breast lumpectomy  01/20/2008    left  . Breast reduction surgery      right    FAMILY HISTORY No family history on file. The patient's father died from a heart attack at age 69. The patient's mother is alive and in her 47s. The patient is one of 8 siblings, 5 girls and 3 boys. One sister died in an automobile accident remotely. There is no known breast or ovarian cancer in the family  GYNECOLOGIC HISTORY:  GX P0, status post total abdominal hysterectomy and bilateral salpingo-oophorectomy in August 2009.  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 Her husband, Nira Retort, is an Dietitian. They have 2 adopted daughters,  [place] and [place]. The patient is a Control and instrumentation engineer.    ADVANCED DIRECTIVES:  HEALTH MAINTENANCE: History  Substance Use Topics  . Smoking status: Never Smoker   . Smokeless tobacco: Never Used  . Alcohol Use: 0.6 oz/week    1 Glasses of wine per week     Colonoscopy: 2012  PAP: UTD  Bone density: DEXA scan May 2012 was normal  Lipid panel: Dr. Laurann Montana  No Known Allergies  Current Outpatient Prescriptions  Medication Sig Dispense Refill  . calcium carbonate 1250 MG capsule Take 1,250 mg by mouth 2 (two) times daily with a meal.      . cholecalciferol (VITAMIN D) 400 UNITS TABS Take 2,000 Units by mouth daily.        . citalopram (CELEXA) 40 MG tablet Take 1 tablet (40 mg total) by mouth daily.  90 tablet  0  . fish oil-omega-3 fatty acids 1000 MG capsule Take 1 g by mouth daily.        Marland Kitchen gabapentin (NEURONTIN) 300 MG capsule Take 1 capsule (300 mg total) by mouth at bedtime as needed (hot flashes and post surgical pain).  30 capsule  11  . ibuprofen (ADVIL,MOTRIN) 200 MG tablet Take 200 mg by mouth every 6 (six) hours as needed.        . Multiple Vitamin (MULTIVITAMIN) tablet Take 1 tablet by mouth daily.        . naproxen (NAPROSYN) 500 MG tablet Take 1 tablet (500 mg total) by mouth 3 (three) times daily with meals as needed.  60 tablet  1  . tamoxifen (NOLVADEX) 20 MG tablet Take 1 tablet (20 mg total) by mouth daily.  30 tablet  11   No current facility-administered medications for this visit.    OBJECTIVE: Middle-aged [place] American female who appears well  ECOG FS: 0  Filed Vitals:   07/16/12 1518  BP: 158/91  Pulse: 81  Temp: 97.6 F (36.4 C)  TempSrc: Oral  Resp: 20  Height: 5\' 4"  (1.626 m)  Weight: 191 lb 4.8 oz (86.773 kg)  Body mass index is 32.82 kg/(m^2).  Sclerae unicteric Oropharynx clear No cervical or supraclavicular adenopathy Lungs no rales or rhonchi Heart regular rate and rhythm Abd benign MSK no focal spinal tenderness, no peripheral  edema Neuro: nonfocal Breasts: The right breast is unremarkable. The left breast is status post lumpectomy and radiation. There is no evidence of local recurrence. Left leg still is benign.  LAB RESULTS: Lab Results  Component Value Date   WBC 6.0 07/09/2012   NEUTROABS 3.4 07/09/2012   HGB 11.2* 07/09/2012   HCT 33.8* 07/09/2012   MCV 85.7 07/09/2012   PLT 177 07/09/2012      Chemistry      Component Value Date/Time   NA 140 07/09/2012 1500   NA 141 10/16/2011 1310   K 3.8 07/09/2012 1500   K 4.0 10/16/2011 1310   CL 103 07/09/2012 1500   CL 104 10/16/2011 1310   CO2 28 07/09/2012 1500   CO2 27 10/16/2011 1310   BUN 15.4 07/09/2012 1500   BUN 12 10/16/2011 1310   CREATININE 0.9 07/09/2012 1500   CREATININE 0.83 10/16/2011 1310      Component Value Date/Time   CALCIUM 9.2 07/09/2012 1500   CALCIUM 9.1 10/16/2011 1310   ALKPHOS  117 07/09/2012 1500   ALKPHOS 125* 10/16/2011 1310   AST 17 07/09/2012 1500   AST 16 10/16/2011 1310   ALT 14 07/09/2012 1500   ALT 14 10/16/2011 1310   BILITOT <0.20 Repeated and Verified 07/09/2012 1500   BILITOT 0.4 10/16/2011 1310       Lab Results  Component Value Date   LABCA2 23 07/09/2012    STUDIES:  Mm Digital Diagnostic Bilat  12/31/2011  *RADIOLOGY REPORT*  Clinical Data:  Patient presents for a bilateral diagnostic mammogram due to a history of a prior left malignant lumpectomy 2009.  Prior right reduction mammoplasty 2010.  DIGITAL DIAGNOSTIC BILATERAL MAMMOGRAM WITH CAD  Comparison:  12/21/2010, 11/21/2009, 11/18/2008 and 10/22/2007  Findings:  Exam demonstrates scattered fibroglandular densities. There are post reduction changes of the right breast.  There are post lumpectomy changes of the left breast which are incompletely visualized despite additional views.  The lumpectomy site is very deep and difficult to get into the field of view. Remainder of the exam is unchanged. Mammographic images were processed with CAD.  IMPRESSION: Stable mammogram with post reduction  changes of the right breast and post lumpectomy changes of the left breast.  Lumpectomy site is difficult to completely image due to its deep location.  RECOMMENDATION: Recommend breast MRI for further evaluation for better assessment of the left lumpectomy site.  The patient's last breast MRI was 2011.  Otherwise recommend continued annual bilateral diagnostic mammographic evaluation.  BI-RADS CATEGORY 0:  Incomplete.  Need additional imaging evaluation and/or prior mammograms for comparison.   Original Report Authenticated By: Elba Barman, M.D.     ASSESSMENT: 52 y.o. BRCA 1-2 negative [place] woman  1. Status post left lumpectomy and sentinel lymph node biopsy in August 2009 for a T2 N1, stage IIB, invasive ductal carcinoma, grade 2, estrogen receptor 87% positive, progesterone receptor 12% positive, with an MIB-1 of 3% and no HER-2 amplification  2. 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  3. Received radiation therapy, completed March 2010  4. Started on tamoxifen in March of 2010 and continued until August of 2012.  5. Began on letrozole in August 2012, discontinued February 2013 because of arthralgias/myalgias.  Resumed tamoxifen in June 2013. 6. Status post TAH/BSO in August 2009 with benign pathology   PLAN: Torunn is doing very well as far as her breast cancer is concerned, now more than 4 years out from her original surgery. The overall plan is to continue tamoxifen until March of 2020. I refilled that prescription for her today. She is going to start seeing [place] on a once a year basis. She knows to call for any problems that may develop before the next visit. MAGRINAT,GUSTAV C    07/16/2012

## 2012-07-16 NOTE — Telephone Encounter (Signed)
, °

## 2012-09-01 ENCOUNTER — Other Ambulatory Visit: Payer: Self-pay | Admitting: *Deleted

## 2012-09-01 DIAGNOSIS — C50919 Malignant neoplasm of unspecified site of unspecified female breast: Secondary | ICD-10-CM

## 2012-09-01 DIAGNOSIS — C50912 Malignant neoplasm of unspecified site of left female breast: Secondary | ICD-10-CM

## 2012-09-02 ENCOUNTER — Other Ambulatory Visit: Payer: Self-pay | Admitting: *Deleted

## 2012-09-02 DIAGNOSIS — C50912 Malignant neoplasm of unspecified site of left female breast: Secondary | ICD-10-CM

## 2012-09-02 DIAGNOSIS — C50919 Malignant neoplasm of unspecified site of unspecified female breast: Secondary | ICD-10-CM

## 2012-09-02 MED ORDER — GABAPENTIN 300 MG PO CAPS: 300.0000 mg | ORAL_CAPSULE | Freq: Every evening | ORAL | Status: DC | PRN

## 2012-09-02 MED ORDER — TAMOXIFEN CITRATE 20 MG PO TABS: 20.0000 mg | ORAL_TABLET | Freq: Every day | ORAL | Status: DC

## 2012-09-02 MED ORDER — CITALOPRAM HYDROBROMIDE 40 MG PO TABS: 40.0000 mg | ORAL_TABLET | Freq: Every day | ORAL | Status: DC

## 2012-09-03 ENCOUNTER — Other Ambulatory Visit: Payer: Self-pay | Admitting: Emergency Medicine

## 2012-09-03 DIAGNOSIS — C50912 Malignant neoplasm of unspecified site of left female breast: Secondary | ICD-10-CM

## 2012-09-03 DIAGNOSIS — C50919 Malignant neoplasm of unspecified site of unspecified female breast: Secondary | ICD-10-CM

## 2012-09-03 MED ORDER — TAMOXIFEN CITRATE 20 MG PO TABS: 20.0000 mg | ORAL_TABLET | Freq: Every day | ORAL | Status: DC

## 2012-09-03 MED ORDER — GABAPENTIN 300 MG PO CAPS: 300.0000 mg | ORAL_CAPSULE | Freq: Every evening | ORAL | Status: DC | PRN

## 2012-09-03 MED ORDER — CITALOPRAM HYDROBROMIDE 40 MG PO TABS: 40.0000 mg | ORAL_TABLET | Freq: Every day | ORAL | Status: DC

## 2012-10-21 ENCOUNTER — Ambulatory Visit (INDEPENDENT_AMBULATORY_CARE_PROVIDER_SITE_OTHER): Payer: Managed Care, Other (non HMO) | Admitting: Surgery

## 2012-10-21 ENCOUNTER — Encounter (INDEPENDENT_AMBULATORY_CARE_PROVIDER_SITE_OTHER): Payer: Self-pay | Admitting: Surgery

## 2012-10-21 VITALS — BP 120/72 | HR 70 | Temp 97.0°F | Resp 16 | Ht 65.0 in | Wt 188.0 lb

## 2012-10-21 DIAGNOSIS — Z853 Personal history of malignant neoplasm of breast: Secondary | ICD-10-CM

## 2012-10-21 NOTE — Progress Notes (Signed)
NAME: Linda Kramer       DOB: Sep 08, 1960           DATE: 10/21/2012       MRN: [id number]   Linda Kramer is a 52 y.o.Marland Kitchenfemale who presents for routine followup of her Left IDC, Stage IIB (T2N1) diagnosed in July, 2009 and treated with lumpectomy, ax diss., chemo, radiation. She has no problems or concerns on either side.  PFSH: She has had no significant changes since the last visit here.  ROS: There have been no significant changes since the last visit here  EXAM:  VS: BP 120/72  Pulse 70  Temp(Src) 97 F (36.1 C) (Temporal)  Resp 16  Ht 5\' 5"  (1.651 m)  Wt 188 lb (85.276 kg)  BMI 31.28 kg/m2   General: The patient is alert, oriented, generally healthy appearing, NAD. Mood and affect are normal.  Breasts:  Right is s/p rfeduction. Left s/p central partial mastectomy. No evidence of recurrence. Left is smaller than the right  Lymphatics: She has no axillary or supraclavicular adenopathy on either side.  Extremities: Full ROM of the surgical side with no lymphedema noted.  Data Reviewed: MRI Oct 2013: IMPRESSION:  Benign postoperative change on the left. No suspicious findings.  BI-RADS CATEGORY 2: Benign finding(s).  RECOMMENDATION:  Remain on schedule for annual diagnostic mammography. Consider  incorporating MRI into surveillance protocol given the location of  the left lumpectomy scar.  THREE-DIMENSIONAL MR IMAGE RENDERING ON INDEPENDENT WORKSTATION:  Three-dimensional MR images were rendered by post-processing of the  original MR data on an independent workstation. The three-  dimensional MR images were interpreted, and findings were reported  in the accompanying complete MRI report for this study.  Original Report Authenticated By: Otilio Carpen, M.D.   Impression: Doing well, with no evidence of recurrent cancer or new cancer  Plan: Will continue to follow up on an annual basis here.

## 2012-10-21 NOTE — Patient Instructions (Signed)
Continue annual mammograms We will see you again on an as needed basis. Please call the office at [phone] if you have any questions or concerns. Thank you for allowing us to take care of you.  

## 2012-12-01 ENCOUNTER — Other Ambulatory Visit: Payer: Self-pay | Admitting: Oncology

## 2012-12-01 DIAGNOSIS — Z853 Personal history of malignant neoplasm of breast: Secondary | ICD-10-CM

## 2013-01-26 ENCOUNTER — Encounter (INDEPENDENT_AMBULATORY_CARE_PROVIDER_SITE_OTHER): Payer: Self-pay

## 2013-01-28 ENCOUNTER — Ambulatory Visit
Admission: RE | Admit: 2013-01-28 | Discharge: 2013-01-28 | Disposition: A | Discharge status: Home / Self Care | Payer: Managed Care, Other (non HMO) | Source: Ambulatory Visit | Attending: Oncology | Admitting: Oncology

## 2013-01-28 DIAGNOSIS — Z853 Personal history of malignant neoplasm of breast: Secondary | ICD-10-CM

## 2013-02-24 ENCOUNTER — Other Ambulatory Visit: Payer: Self-pay | Admitting: *Deleted

## 2013-02-24 DIAGNOSIS — C50919 Malignant neoplasm of unspecified site of unspecified female breast: Secondary | ICD-10-CM

## 2013-02-24 MED ORDER — CITALOPRAM HYDROBROMIDE 40 MG PO TABS: 40.0000 mg | ORAL_TABLET | Freq: Every day | ORAL | Status: DC

## 2013-07-01 ENCOUNTER — Other Ambulatory Visit: Payer: Self-pay | Admitting: Physician Assistant

## 2013-07-02 ENCOUNTER — Telehealth: Payer: Self-pay | Admitting: Oncology

## 2013-07-02 NOTE — Telephone Encounter (Signed)
, °

## 2013-07-15 ENCOUNTER — Other Ambulatory Visit: Payer: Self-pay | Admitting: Hematology and Oncology

## 2013-07-15 DIAGNOSIS — C50912 Malignant neoplasm of unspecified site of left female breast: Secondary | ICD-10-CM

## 2013-07-16 ENCOUNTER — Ambulatory Visit: Payer: Managed Care, Other (non HMO) | Admitting: Physician Assistant

## 2013-07-16 ENCOUNTER — Telehealth: Payer: Self-pay | Admitting: *Deleted

## 2013-07-16 ENCOUNTER — Other Ambulatory Visit: Payer: Managed Care, Other (non HMO)

## 2013-07-16 ENCOUNTER — Ambulatory Visit (HOSPITAL_BASED_OUTPATIENT_CLINIC_OR_DEPARTMENT_OTHER): Payer: Managed Care, Other (non HMO) | Admitting: Hematology and Oncology

## 2013-07-16 ENCOUNTER — Other Ambulatory Visit (HOSPITAL_BASED_OUTPATIENT_CLINIC_OR_DEPARTMENT_OTHER): Payer: Managed Care, Other (non HMO)

## 2013-07-16 VITALS — BP 128/78 | HR 83 | Temp 98.3°F | Resp 20 | Ht 65.0 in | Wt 185.0 lb

## 2013-07-16 DIAGNOSIS — C50912 Malignant neoplasm of unspecified site of left female breast: Secondary | ICD-10-CM

## 2013-07-16 DIAGNOSIS — R109 Unspecified abdominal pain: Secondary | ICD-10-CM

## 2013-07-16 DIAGNOSIS — D649 Anemia, unspecified: Secondary | ICD-10-CM

## 2013-07-16 DIAGNOSIS — R059 Cough, unspecified: Secondary | ICD-10-CM

## 2013-07-16 DIAGNOSIS — Z90710 Acquired absence of both cervix and uterus: Secondary | ICD-10-CM

## 2013-07-16 DIAGNOSIS — Z923 Personal history of irradiation: Secondary | ICD-10-CM

## 2013-07-16 DIAGNOSIS — C50919 Malignant neoplasm of unspecified site of unspecified female breast: Secondary | ICD-10-CM

## 2013-07-16 DIAGNOSIS — Z170 Estrogen receptor positive status [ER+]: Secondary | ICD-10-CM

## 2013-07-16 DIAGNOSIS — R05 Cough: Secondary | ICD-10-CM

## 2013-07-16 DIAGNOSIS — K219 Gastro-esophageal reflux disease without esophagitis: Secondary | ICD-10-CM

## 2013-07-16 LAB — CBC WITH DIFFERENTIAL/PLATELET
BASO%: 0.4 % (ref 0.0–2.0)
BASOS ABS: 0 10*3/uL (ref 0.0–0.1)
EOS ABS: 0.1 10*3/uL (ref 0.0–0.5)
EOS%: 1.3 % (ref 0.0–7.0)
HCT: 35.7 % (ref 34.8–46.6)
HEMOGLOBIN: 11.4 g/dL — AB (ref 11.6–15.9)
LYMPH#: 1.7 10*3/uL (ref 0.9–3.3)
LYMPH%: 30.2 % (ref 14.0–49.7)
MCH: 27.8 pg (ref 25.1–34.0)
MCHC: 32 g/dL (ref 31.5–36.0)
MCV: 86.8 fL (ref 79.5–101.0)
MONO#: 0.4 10*3/uL (ref 0.1–0.9)
MONO%: 7.5 % (ref 0.0–14.0)
NEUT%: 60.6 % (ref 38.4–76.8)
NEUTROS ABS: 3.5 10*3/uL (ref 1.5–6.5)
Platelets: 177 10*3/uL (ref 145–400)
RBC: 4.11 10*6/uL (ref 3.70–5.45)
RDW: 13.7 % (ref 11.2–14.5)
WBC: 5.8 10*3/uL (ref 3.9–10.3)

## 2013-07-16 LAB — COMPREHENSIVE METABOLIC PANEL (CC13)
ALBUMIN: 3.9 g/dL (ref 3.5–5.0)
ALT: 15 U/L (ref 0–55)
ANION GAP: 13 meq/L — AB (ref 3–11)
AST: 18 U/L (ref 5–34)
Alkaline Phosphatase: 102 U/L (ref 40–150)
BUN: 14 mg/dL (ref 7.0–26.0)
CALCIUM: 9.3 mg/dL (ref 8.4–10.4)
CHLORIDE: 104 meq/L (ref 98–109)
CO2: 26 meq/L (ref 22–29)
Creatinine: 0.8 mg/dL (ref 0.6–1.1)
GLUCOSE: 94 mg/dL (ref 70–140)
POTASSIUM: 3.9 meq/L (ref 3.5–5.1)
SODIUM: 143 meq/L (ref 136–145)
TOTAL PROTEIN: 7.7 g/dL (ref 6.4–8.3)
Total Bilirubin: <0.2 mg/dL (ref 0.20–1.20)

## 2013-07-16 NOTE — Telephone Encounter (Signed)
appts made and printed...td 

## 2013-07-17 LAB — VITAMIN D 25 HYDROXY (VIT D DEFICIENCY, FRACTURES): VIT D 25 HYDROXY: 37 ng/mL (ref 30–89)

## 2013-07-17 LAB — CANCER ANTIGEN 27.29: CA 27.29: 25 U/mL (ref 0–39)

## 2013-07-30 ENCOUNTER — Other Ambulatory Visit: Payer: Self-pay

## 2013-07-30 NOTE — Progress Notes (Signed)
. [name]   DOB: [birth year]  MR#: [id number]  CSN#:[id number]  PCP: Vidal Schwalbe, MD GYN: SUOsborn Coho OTHER MD:   HISTORY OF PRESENT ILLNESS: The patient had her routine screening mammogram on 10/22/2007 which showed a possible mass in the left breast. A left diagnostic mammogram and ultrasound on 11/10/2007 with spot compression views showed a 2.5 cm mass in the upper left breast posteriorly, also palpable. All ultrasound confirmed a 2.6 the regular hypoechoic lesion with an adjacent 1 cm angulated satellite lesion. Biopsy on 11/10/2007 [phone] and [id number]) showed an invasive ductal carcinoma, ER positive at 87%, PR positive at 12%, HER-2/neu negative, with a very low proliferation fraction of 3%.  Bilateral breast MRIs were obtained 11/18/2007, again noting a 2.3 cm oval mass in the left breast with spiculated margins and a satellite nodule immediately anterior to this. Together, both masses measured 3.4 cm. No abnormality noted in the right breast.  Patient underwent left lumpectomy and sentinel lymph node sampling on 12/08/2007 under the care of Dr. Margot Chimes, with final pathology [id number]) confirming a 3.8 cm invasive ductal carcinoma, grade 2 with 2 of 4 sentinel lymph nodes involved. 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  Patient underwent additional surgery for clear margin in September 2009. Consisted of a left partial mastectomy with removal of the areolar complex. Axillary node dissection was also performed, an additional 6 lymph nodes evaluated, one of which was involved with metastatic ductal carcinoma. At the time of surgery, patient also underwent total abdominal hysterectomy and bilateral salpingo-oophorectomy with benign pathology. [phone])  The patient underwent radiation therapy, after which she began on tamoxifen in March of 2010. Tamoxifen was continued until August of 2012 at which time the patient switched to  letrozole. Letrozole was then discontinued in February 2013 due to arthralgias and myalgias. After a three-month "wash out" period,the  patient started back on tamoxifen in June of 2013.  INTERVAL HISTORY: Linda Kramer returns today for followup of her left breast carcinoma. She is tolerating the tamoxifen well, with mild hot flashes .She reports cough over the past few months and possible postnasal drip,reports symptoms of reflux and heartburn for which she takes Nexium. She  notes abdominal pain/feeling sore in the midabdomen.She denies nausea/vomiting.Appetite is good.She has gained 20 pounds over the past year .  ROS  She sometimes has leg cramps at night, blurred vision at times, gets short of breath when walking up stairs,no chest pain, pressure, or palpitations, and occasionally has joint aches and pains which are not even persistent or intense.  A detailed review of systems today was otherwise noncontributory.  PAST MEDICAL HISTORY: Past Medical History  Diagnosis Date  . Breast cancer, left breast 04/18/2011  . History of blood transfusion   Significant for fibroids, varicose veins, anemia, endometriosis and wisdom teeth removal.  PAST SURGICAL HISTORY: Past Surgical History  Procedure Laterality Date  . Breast lumpectomy  01/20/2008    left  . Breast reduction surgery      right    FAMILY HISTORY Family History  Problem Relation Age of Onset  . Hypertension Mother   . Arthritis Mother    The patient's father died from a heart attack at age 76. The patient's mother is alive and in her 17s. The patient is one of 8 siblings, 5 girls and 3 boys. One sister died in an automobile accident remotely. There is no known breast or ovarian cancer in the family  GYNECOLOGIC  HISTORY:  GX P0, status post total abdominal hysterectomy and bilateral salpingo-oophorectomy in August 2009.   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 Her  husband, Nicholes Mango, is an Event organiser. They have 2 adopted daughters, [place] and [place]. The patient is a Psychologist, forensic.    ADVANCED DIRECTIVES:  HEALTH MAINTENANCE: History  Substance Use Topics  . Smoking status: Never Smoker   . Smokeless tobacco: Never Used  . Alcohol Use: 0.6 oz/week    1 Glasses of wine per week     Colonoscopy: 2012  PAP: UTD  Bone density: DEXA scan May 2012 was normal  Lipid panel: Dr. [name]  No Known Allergies  Current Outpatient Prescriptions  Medication Sig Dispense Refill  . calcium carbonate 1250 MG capsule Take 1,250 mg by mouth 2 (two) times daily with a meal.      . cholecalciferol (VITAMIN D) 400 UNITS TABS Take 2,000 Units by mouth daily.        . citalopram (CELEXA) 40 MG tablet Take 1 tablet (40 mg total) by mouth daily.  90 tablet  1  . fish oil-omega-3 fatty acids 1000 MG capsule Take 1 g by mouth daily.        Marland Kitchen gabapentin (NEURONTIN) 300 MG capsule Take 1 capsule (300 mg total) by mouth at bedtime as needed (hot flashes and post surgical pain).  30 capsule  11  . Multiple Vitamin (MULTIVITAMIN) tablet Take 1 tablet by mouth daily.        . tamoxifen (NOLVADEX) 20 MG tablet Take 1 tablet (20 mg total) by mouth daily.  30 tablet  12  . ibuprofen (ADVIL,MOTRIN) 200 MG tablet Take 200 mg by mouth every 6 (six) hours as needed.         No current facility-administered medications for this visit.    OBJECTIVE: Middle-aged [place] American female who appears well  ECOG FS: 0  Filed Vitals:   07/16/13 1533  BP: 128/78  Pulse: 83  Temp: 98.3 F (36.8 C)  TempSrc: Oral  Resp: 20  Height: 5' 5"  (1.651 m)  Weight: 185 lb (83.915 kg)  Body mass index is 30.79 kg/(m^2).  Sclerae unicteric Oropharynx clear No cervical or supraclavicular adenopathy,thyroid appears full. Lungs no rales or rhonchi Heart regular rate and rhythm Abd benign no abdominal tenderness or hepatosplenomegaly MSK no focal spinal tenderness, no  peripheral edema Neuro: nonfocal Breasts: The right breast is unremarkable. The left breast is status post lumpectomy and radiation. There is no evidence of local recurrence. LAB RESULTS: Lab Results  Component Value Date   WBC 5.8 07/16/2013   NEUTROABS 3.5 07/16/2013   HGB 11.4* 07/16/2013   HCT 35.7 07/16/2013   MCV 86.8 07/16/2013   PLT 177 07/16/2013      Chemistry      Component Value Date/Time   NA 143 07/16/2013 1522   NA 141 10/16/2011 1310   K 3.9 07/16/2013 1522   K 4.0 10/16/2011 1310   CL 103 07/09/2012 1500   CL 104 10/16/2011 1310   CO2 26 07/16/2013 1522   CO2 27 10/16/2011 1310   BUN 14.0 07/16/2013 1522   BUN 12 10/16/2011 1310   CREATININE 0.8 07/16/2013 1522   CREATININE 0.83 10/16/2011 1310      Component Value Date/Time   CALCIUM 9.3 07/16/2013 1522   CALCIUM 9.1 10/16/2011 1310   ALKPHOS 102 07/16/2013 1522   ALKPHOS 125* 10/16/2011 1310   AST  18 07/16/2013 1522   AST 16 10/16/2011 1310   ALT 15 07/16/2013 1522   ALT 14 10/16/2011 1310   BILITOT <0.20 07/16/2013 1522   BILITOT 0.4 10/16/2011 1310       Lab Results  Component Value Date   LABCA2 25 07/16/2013    STUDIES:  Mm Digital Diagnostic Bilat  12/31/2011  *RADIOLOGY REPORT*  Clinical Data:  Patient presents for a bilateral diagnostic mammogram due to a history of a prior left malignant lumpectomy 2009.  Prior right reduction mammoplasty 2010.  DIGITAL DIAGNOSTIC BILATERAL MAMMOGRAM WITH CAD  Comparison:  12/21/2010, 11/21/2009, 11/18/2008 and 10/22/2007  Findings:  Exam demonstrates scattered fibroglandular densities. There are post reduction changes of the right breast.  There are post lumpectomy changes of the left breast which are incompletely visualized despite additional views.  The lumpectomy site is very deep and difficult to get into the field of view. Remainder of the exam is unchanged. Mammographic images were processed with CAD.  IMPRESSION: Stable mammogram with post reduction changes of the right  breast and post lumpectomy changes of the left breast.  Lumpectomy site is difficult to completely image due to its deep location.  RECOMMENDATION: Recommend breast MRI for further evaluation for better assessment of the left lumpectomy site.  The patient's last breast MRI was 2011.  Otherwise recommend continued annual bilateral diagnostic mammographic evaluation.  BI-RADS CATEGORY 0:  Incomplete.  Need additional imaging evaluation and/or prior mammograms for comparison.   Original Report Authenticated By: Pearletha Alfred, M.D.     ASSESSMENT: 53 y.o. BRCA 1-2 negative  woman  1. Status post left lumpectomy and sentinel lymph node biopsy in August 2009 for a T2 N1, stage IIB, invasive ductal carcinoma, grade 2, estrogen receptor 87% positive, progesterone receptor 12% positive, with an MIB-1 of 3% and no HER-2 amplification  2. 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  3. Received radiation therapy, completed March 2010  4. Started on tamoxifen in March of 2010 and continued until August of 2012.  5. Began on letrozole in August 2012, discontinued February 2013 because of arthralgias/myalgias.  Resumed tamoxifen in June 2013. 6. Status post TAH/BSO in August 2009 with benign pathology   PLAN: Patientis doing well as far as her breast cancer is concerned, now more than 4 years out from her original surgery. The overall plan is to continue tamoxifen until March of 2020 Due for mammogram 01/28/2014 will schedule.  Advised patient to have her thyroid checked with her Primary MD,consider U/S  thyroid  Hgb slightly low will add ferritin on return.  Cough rather persistent ?postnasal drip will check xray    Due to her abdominal complains will refer patient to GI.  Follow up in 6 months   She knows to call for any problems that may develop before the next visit.  Owens Loffler,  [facility]   07/30/2013

## 2013-07-31 ENCOUNTER — Other Ambulatory Visit: Payer: Self-pay | Admitting: *Deleted

## 2013-07-31 DIAGNOSIS — C50912 Malignant neoplasm of unspecified site of left female breast: Secondary | ICD-10-CM

## 2013-07-31 MED ORDER — TAMOXIFEN CITRATE 20 MG PO TABS: 20.0000 mg | ORAL_TABLET | Freq: Every day | ORAL | Status: DC

## 2013-08-05 ENCOUNTER — Other Ambulatory Visit: Payer: Self-pay

## 2013-12-10 ENCOUNTER — Ambulatory Visit: Payer: Managed Care, Other (non HMO)

## 2013-12-17 ENCOUNTER — Ambulatory Visit: Payer: Managed Care, Other (non HMO)

## 2013-12-24 ENCOUNTER — Ambulatory Visit: Payer: Managed Care, Other (non HMO)

## 2014-01-18 ENCOUNTER — Telehealth: Payer: Self-pay | Admitting: Oncology

## 2014-01-18 ENCOUNTER — Ambulatory Visit (HOSPITAL_BASED_OUTPATIENT_CLINIC_OR_DEPARTMENT_OTHER): Payer: Managed Care, Other (non HMO) | Admitting: Oncology

## 2014-01-18 ENCOUNTER — Other Ambulatory Visit: Payer: Managed Care, Other (non HMO)

## 2014-01-18 VITALS — BP 130/70 | HR 68 | Temp 98.3°F | Resp 20 | Ht 65.0 in | Wt 176.5 lb

## 2014-01-18 DIAGNOSIS — C50912 Malignant neoplasm of unspecified site of left female breast: Secondary | ICD-10-CM

## 2014-01-18 DIAGNOSIS — C50919 Malignant neoplasm of unspecified site of unspecified female breast: Secondary | ICD-10-CM

## 2014-01-18 NOTE — Progress Notes (Signed)
ID: Linda Kramer   DOB: 1960-05-23  MR#: [id number]  CSN#:[id number]  PCP: Vidal Schwalbe, MD GYN: SU[facility] Coho OTHER MD:   CHIEF COMPLAINT: Estrogen receptor positive breast cancer  CURRENT TREATMENT: Tamoxifen  HISTORY OF PRESENT ILLNESS: From her initial intake note: The patient had her routine screening mammogram on 10/22/2007 which showed a possible mass in the left breast. A left diagnostic mammogram and ultrasound on 11/10/2007 with spot compression views showed a 2.5 cm mass in the upper left breast posteriorly, also palpable. All ultrasound confirmed a 2.6 the regular hypoechoic lesion with an adjacent 1 cm angulated satellite lesion. Biopsy on 11/10/2007 [phone] and [id number]) showed an invasive ductal carcinoma, ER positive at 87%, PR positive at 12%, HER-2/neu negative, with a very low proliferation fraction of 3%.  Bilateral breast MRIs were obtained 11/18/2007, again noting a 2.3 cm oval mass in the left breast with spiculated margins and a satellite nodule immediately anterior to this. Together, both masses measured 3.4 cm. No abnormality noted in the right breast.  Patient underwent left lumpectomy and sentinel lymph node sampling on 12/08/2007 under the care of Dr. Margot Chimes, with final pathology [id number]) confirming a 3.8 cm invasive ductal carcinoma, grade 2 with 2 of 4 sentinel lymph nodes involved. 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  Patient underwent additional surgery for clear margin in September 2009. Consisted of a left partial mastectomy with removal of the areolar complex. Axillary node dissection was also performed, an additional 6 lymph nodes evaluated, one of which was involved with metastatic ductal carcinoma. At the time of surgery, patient also underwent total abdominal hysterectomy and bilateral salpingo-oophorectomy with benign pathology. [phone])  The patient underwent radiation therapy, after which she  began on tamoxifen in March of 2010. Tamoxifen was continued until August of 2012 at which time the patient switched to letrozole. Letrozole was then discontinued in February 2013 due to arthralgias and myalgias. After a three-month "wash out" period,the  patient started back on tamoxifen in June of 2013.  Her subsequent history is as detailed below  INTERVAL HISTORY: Linda Kramer returns today for followup of her left breast carcinoma. Everything is "good". She is tolerating, since then with mild hot flashes during the day. A nighttime hot flashes are well-controlled when she remembers to take her gabapentin at bedtime. She doesn't have significant problems with vaginal dryness or stickiness. 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 This is not constant. She has some sinus problems. She needs to see the dentist, she tells me. She is short of breath when walking up stairs. She has some heartburn issues. Sometimes she has pain in the surgical breast. She is tolerating her diabetes medicines with no diarrhea. A detailed review of systems today was otherwise stable  PAST MEDICAL HISTORY: Past Medical History  Diagnosis Date  . Breast cancer, left breast 04/18/2011  . History of blood transfusion   Significant for fibroids, varicose veins, anemia, endometriosis and wisdom teeth removal.  PAST SURGICAL HISTORY: Past Surgical History  Procedure Laterality Date  . Breast lumpectomy  01/20/2008    left  . Breast reduction surgery      right    FAMILY HISTORY Family History  Problem Relation Age of Onset  . Hypertension Mother   . Arthritis Mother    The patient's father  died from a heart attack at age 32. The patient's mother is alive and in her 38s. The patient is one of 8 siblings, 5 girls and 3 boys. One sister died in an automobile accident remotely. There is no known  breast or ovarian cancer in the family  GYNECOLOGIC HISTORY:  [place] P0, status post total abdominal hysterectomy and bilateral salpingo-oophorectomy in August 2009.   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 Her husband, Nicholes Mango, is an Event organiser. They have 2 adopted daughters, [place] and [place]. The patient is a Psychologist, forensic.    ADVANCED DIRECTIVES:  HEALTH MAINTENANCE: History  Substance Use Topics  . Smoking status: Never Smoker   . Smokeless tobacco: Never Used  . Alcohol Use: 0.6 oz/week    1 Glasses of wine per week     Colonoscopy: 2012  PAP: UTD  Bone density: DEXA scan May 2012 was normal  Lipid panel: Dr. [name]  No Known Allergies  Current Outpatient Prescriptions  Medication Sig Dispense Refill  . calcium carbonate 1250 MG capsule Take 1,250 mg by mouth 2 (two) times daily with a meal.      . cholecalciferol (VITAMIN D) 400 UNITS TABS Take 2,000 Units by mouth daily.        . citalopram (CELEXA) 40 MG tablet Take 1 tablet (40 mg total) by mouth daily.  90 tablet  1  . fish oil-omega-3 fatty acids 1000 MG capsule Take 1 g by mouth daily.        Marland Kitchen gabapentin (NEURONTIN) 300 MG capsule Take 1 capsule (300 mg total) by mouth at bedtime as needed (hot flashes and post surgical pain).  30 capsule  11  . ibuprofen (ADVIL,MOTRIN) 200 MG tablet Take 200 mg by mouth every 6 (six) hours as needed.        . Multiple Vitamin (MULTIVITAMIN) tablet Take 1 tablet by mouth daily.        . tamoxifen (NOLVADEX) 20 MG tablet Take 1 tablet (20 mg total) by mouth daily.  30 tablet  12   No current facility-administered medications for this visit.    OBJECTIVE: Middle-aged [place] American woman in no acute distress  ECOG FS: 0  Filed Vitals:   01/18/14 1556  BP: 130/70  Pulse: 68  Temp: 98.3 F (36.8 C)  TempSrc: Oral  Resp: 20  Height: _0  (1.651 m)  Weight: 176 lb 8 oz (80.06 kg)  Body mass index is  29.37 kg/(m^2).  Sclerae unicteric, pupils equal and reactive Oropharynx clear and moist No cervical or supraclavicular adenopathy Lungs no rales or rhonchi Heart regular rate and rhythm Abd soft, nontender, positive bowel sounds MSK no focal spinal tenderness, no upper extremity lymphedema Neuro: nonfocal, well oriented, positive affect Breasts: The right breast is unremarkable. The left breast is status post mastectomy and reconstruction. There is no evidence of local recurrence. The left axilla is benign    LAB RESULTS: Lab Results  Component Value Date   WBC 5.8 07/16/2013   NEUTROABS 3.5 07/16/2013   HGB 11.4* 07/16/2013   HCT 35.7 07/16/2013   MCV 86.8 07/16/2013   PLT 177 07/16/2013      Chemistry      Component Value Date/Time   NA 143 07/16/2013 1522   NA 141 10/16/2011 1310   K 3.9 07/16/2013 1522   K 4.0 10/16/2011 1310   CL 103 07/09/2012 1500   CL 104 10/16/2011 1310  CO2 26 07/16/2013 1522   CO2 27 10/16/2011 1310   BUN 14.0 07/16/2013 1522   BUN 12 10/16/2011 1310   CREATININE 0.8 07/16/2013 1522   CREATININE 0.83 10/16/2011 1310      Component Value Date/Time   CALCIUM 9.3 07/16/2013 1522   CALCIUM 9.1 10/16/2011 1310   ALKPHOS 102 07/16/2013 1522   ALKPHOS 125* 10/16/2011 1310   AST 18 07/16/2013 1522   AST 16 10/16/2011 1310   ALT 15 07/16/2013 1522   ALT 14 10/16/2011 1310   BILITOT <0.20 07/16/2013 1522   BILITOT 0.4 10/16/2011 1310       Lab Results  Component Value Date   LABCA2 25 07/16/2013    STUDIES: Repeat mammography scheduled for later this month.  ASSESSMENT: 53 y.o. BRCA 1-2 negative [place] woman   1. Status post left lumpectomy and sentinel lymph node biopsy in August 2009 for a T2 N1, stage IIB, invasive ductal carcinoma, grade 2, estrogen receptor 87% positive, progesterone receptor 12% positive, with an MIB-1 of 3% and no HER-2 amplification   2. Received adjuvant chemotherapy with 4 dose dense cycles of doxorubicin and cyclophosphamide  followed by weekly paclitaxel x5, discontinued due to neuropathy.   3. Received radiation therapy, completed March 2010   4. Started on tamoxifen in March of 2010 and continued until August of 2012.   5. Began on letrozole in August 2012, discontinued February 2013 because of arthralgias/myalgias.  Resumed tamoxifen in June 2013.  6. Status post TAH/BSO in August 2009 with benign pathology   PLAN: Linda Kramer is tolerating the tamoxifen well, and the plan is going to be to continue that for a total of 10 years, which is going to take [place] to March of 2020.  She was supposed to have lab work today but from some confusion due to the fact that her labwork had been entered by a physician who is no longer here, it will have to be done tomorrow. She is also scheduled for mammography later this month.  I have encouraged her to exercise on a more regular basis. 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  She will see me again in one year. She knows to call for any problems that may develop before her next visit here. Amias Hutchinson C    01/18/2014

## 2014-01-18 NOTE — Telephone Encounter (Signed)
per pof to sch pt appt-gave pt copy of sch °

## 2014-01-18 NOTE — Addendum Note (Signed)
Addended by: Renford Dills on: 01/18/2014 05:04 PM   Modules accepted: Orders

## 2014-01-19 ENCOUNTER — Other Ambulatory Visit (HOSPITAL_BASED_OUTPATIENT_CLINIC_OR_DEPARTMENT_OTHER): Payer: Managed Care, Other (non HMO)

## 2014-01-19 ENCOUNTER — Other Ambulatory Visit: Payer: Self-pay | Admitting: Emergency Medicine

## 2014-01-19 DIAGNOSIS — C50912 Malignant neoplasm of unspecified site of left female breast: Secondary | ICD-10-CM

## 2014-01-19 DIAGNOSIS — C50919 Malignant neoplasm of unspecified site of unspecified female breast: Secondary | ICD-10-CM

## 2014-01-19 LAB — COMPREHENSIVE METABOLIC PANEL (CC13)
ALBUMIN: 3.5 g/dL (ref 3.5–5.0)
ALT: 11 U/L (ref 0–55)
AST: 14 U/L (ref 5–34)
Alkaline Phosphatase: 100 U/L (ref 40–150)
Anion Gap: 8 mEq/L (ref 3–11)
BUN: 15.3 mg/dL (ref 7.0–26.0)
CALCIUM: 8.8 mg/dL (ref 8.4–10.4)
CHLORIDE: 105 meq/L (ref 98–109)
CO2: 27 mEq/L (ref 22–29)
CREATININE: 0.9 mg/dL (ref 0.6–1.1)
GLUCOSE: 99 mg/dL (ref 70–140)
POTASSIUM: 3.8 meq/L (ref 3.5–5.1)
Sodium: 141 mEq/L (ref 136–145)
Total Bilirubin: 0.24 mg/dL (ref 0.20–1.20)
Total Protein: 7 g/dL (ref 6.4–8.3)

## 2014-01-19 LAB — CBC & DIFF AND RETIC
BASO%: 0.4 % (ref 0.0–2.0)
BASOS ABS: 0 10*3/uL (ref 0.0–0.1)
EOS ABS: 0.1 10*3/uL (ref 0.0–0.5)
EOS%: 1.6 % (ref 0.0–7.0)
HCT: 34.5 % — ABNORMAL LOW (ref 34.8–46.6)
HGB: 10.9 g/dL — ABNORMAL LOW (ref 11.6–15.9)
Immature Retic Fract: 7.9 % (ref 1.60–10.00)
LYMPH%: 41.3 % (ref 14.0–49.7)
MCH: 27.7 pg (ref 25.1–34.0)
MCHC: 31.6 g/dL (ref 31.5–36.0)
MCV: 87.6 fL (ref 79.5–101.0)
MONO#: 0.4 10*3/uL (ref 0.1–0.9)
MONO%: 7.8 % (ref 0.0–14.0)
NEUT#: 2.5 10*3/uL (ref 1.5–6.5)
NEUT%: 48.9 % (ref 38.4–76.8)
PLATELETS: 162 10*3/uL (ref 145–400)
RBC: 3.94 10*6/uL (ref 3.70–5.45)
RDW: 13.4 % (ref 11.2–14.5)
RETIC %: 1.76 % (ref 0.70–2.10)
RETIC CT ABS: 69.34 10*3/uL (ref 33.70–90.70)
WBC: 5 10*3/uL (ref 3.9–10.3)
lymph#: 2.1 10*3/uL (ref 0.9–3.3)

## 2014-01-20 LAB — FERRITIN CHCC: Ferritin: 163 ng/ml (ref 9–269)

## 2014-01-21 ENCOUNTER — Encounter: Payer: Managed Care, Other (non HMO) | Attending: Family Medicine

## 2014-01-21 VITALS — Ht 64.0 in | Wt 177.4 lb

## 2014-01-21 DIAGNOSIS — Z713 Dietary counseling and surveillance: Secondary | ICD-10-CM | POA: Diagnosis not present

## 2014-01-21 DIAGNOSIS — E119 Type 2 diabetes mellitus without complications: Secondary | ICD-10-CM | POA: Insufficient documentation

## 2014-01-22 NOTE — Progress Notes (Signed)
Patient was seen on 01/21/14 for the first of a series of three diabetes self-management courses at [facility].  Patient Education Plan per assessed needs and concerns is to attend four course education program for Diabetes Self Management Education.  Current HbA1c: 7.6%  The following learning objectives were met by the patient during this class:  Describe diabetes  State some common risk factors for diabetes  Defines the role of glucose and insulin  Identifies type of diabetes and pathophysiology  Describe the relationship between diabetes and cardiovascular risk  State the members of the Healthcare Team  States the rationale for glucose monitoring  State when to test glucose  State their individual Target Range  State the importance of logging glucose readings  Describe how to interpret glucose readings  Identifies A1C target  Explain the correlation between A1c and eAG values  State symptoms and treatment of high blood glucose  State symptoms and treatment of low blood glucose  Explain proper technique for glucose testing  Identifies proper sharps disposal  Handouts given during class include:  Living Well with Diabetes book  Carb Counting and Meal Planning book  Meal Plan Card  Carbohydrate guide  Meal planning worksheet  Low Sodium Flavoring Tips  The diabetes portion plate  G2E to eAG Conversion Chart  Diabetes Medications  Diabetes Recommended Care Schedule  Support Group  Diabetes Success Plan  Core Class Satisfaction Survey  Follow-Up Plan:  Attend core 2

## 2014-01-28 DIAGNOSIS — E119 Type 2 diabetes mellitus without complications: Secondary | ICD-10-CM | POA: Diagnosis not present

## 2014-01-29 ENCOUNTER — Ambulatory Visit
Admission: RE | Admit: 2014-01-29 | Discharge: 2014-01-29 | Disposition: A | Discharge status: Home / Self Care | Payer: Managed Care, Other (non HMO) | Source: Ambulatory Visit | Attending: Hematology and Oncology | Admitting: Hematology and Oncology

## 2014-01-29 DIAGNOSIS — C50912 Malignant neoplasm of unspecified site of left female breast: Secondary | ICD-10-CM

## 2014-01-29 NOTE — Progress Notes (Signed)

## 2014-02-04 ENCOUNTER — Encounter: Payer: Managed Care, Other (non HMO) | Attending: Family Medicine

## 2014-02-04 DIAGNOSIS — Z713 Dietary counseling and surveillance: Secondary | ICD-10-CM | POA: Insufficient documentation

## 2014-02-04 DIAGNOSIS — E119 Type 2 diabetes mellitus without complications: Secondary | ICD-10-CM | POA: Insufficient documentation

## 2014-02-17 NOTE — Progress Notes (Signed)
Patient was seen on 02/04/14 for the third of a series of three diabetes self-management courses at the [facility]. The following learning objectives were met by the patient during this class:    State the amount of activity recommended for healthy living   Describe activities suitable for individual needs   Identify ways to regularly incorporate activity into daily life   Identify barriers to activity and ways to over come these barriers  Identify diabetes medications being personally used and their primary action for lowering glucose and possible side effects   Describe role of stress on blood glucose and develop strategies to address psychosocial issues   Identify diabetes complications and ways to prevent them  Explain how to manage diabetes during illness   Evaluate success in meeting personal goal   Establish 2-3 goals that they will plan to diligently work on until they return for the  61-monthfollow-up visit  Goals:   I will count my carb choices at most meals and snacks  I will be active 5 times a week  I will take my diabetes medications as scheduled  I will eat less unhealthy fats by eating less   I will test my glucose at least  2 days a week  Your patient has identified these potential barriers to change:  Motivation Finances Stress  Your patient has identified their diabetes self-care support plan as  [facility]  Attend Core 4 in 4 months

## 2014-04-20 ENCOUNTER — Telehealth: Payer: Self-pay

## 2014-04-20 DIAGNOSIS — G35 Multiple sclerosis: Secondary | ICD-10-CM

## 2014-04-20 NOTE — Telephone Encounter (Signed)
Expired referral re entered

## 2014-06-07 ENCOUNTER — Ambulatory Visit: Payer: Managed Care, Other (non HMO)

## 2014-08-06 ENCOUNTER — Other Ambulatory Visit: Payer: Self-pay | Admitting: Oncology

## 2014-11-16 ENCOUNTER — Encounter: Payer: Self-pay | Admitting: Genetic Counselor

## 2015-01-04 ENCOUNTER — Other Ambulatory Visit: Payer: Self-pay | Admitting: Oncology

## 2015-01-04 DIAGNOSIS — C50912 Malignant neoplasm of unspecified site of left female breast: Secondary | ICD-10-CM

## 2015-01-04 DIAGNOSIS — Z9889 Other specified postprocedural states: Secondary | ICD-10-CM

## 2015-01-17 ENCOUNTER — Other Ambulatory Visit (HOSPITAL_BASED_OUTPATIENT_CLINIC_OR_DEPARTMENT_OTHER): Payer: Managed Care, Other (non HMO)

## 2015-01-17 DIAGNOSIS — C50912 Malignant neoplasm of unspecified site of left female breast: Secondary | ICD-10-CM

## 2015-01-17 DIAGNOSIS — C50812 Malignant neoplasm of overlapping sites of left female breast: Secondary | ICD-10-CM | POA: Diagnosis not present

## 2015-01-17 LAB — CBC WITH DIFFERENTIAL/PLATELET
BASO%: 0.5 % (ref 0.0–2.0)
BASOS ABS: 0 10*3/uL (ref 0.0–0.1)
EOS%: 1.4 % (ref 0.0–7.0)
Eosinophils Absolute: 0.1 10*3/uL (ref 0.0–0.5)
HEMATOCRIT: 38.3 % (ref 34.8–46.6)
HEMOGLOBIN: 12.4 g/dL (ref 11.6–15.9)
LYMPH%: 33.3 % (ref 14.0–49.7)
MCH: 28 pg (ref 25.1–34.0)
MCHC: 32.3 g/dL (ref 31.5–36.0)
MCV: 86.5 fL (ref 79.5–101.0)
MONO#: 0.4 10*3/uL (ref 0.1–0.9)
MONO%: 7 % (ref 0.0–14.0)
NEUT#: 3.1 10*3/uL (ref 1.5–6.5)
NEUT%: 57.8 % (ref 38.4–76.8)
Platelets: 202 10*3/uL (ref 145–400)
RBC: 4.43 10*6/uL (ref 3.70–5.45)
RDW: 13.7 % (ref 11.2–14.5)
WBC: 5.3 10*3/uL (ref 3.9–10.3)
lymph#: 1.8 10*3/uL (ref 0.9–3.3)

## 2015-01-17 LAB — COMPREHENSIVE METABOLIC PANEL (CC13)
ALK PHOS: 110 U/L (ref 40–150)
ALT: 12 U/L (ref 0–55)
AST: 15 U/L (ref 5–34)
Albumin: 3.8 g/dL (ref 3.5–5.0)
Anion Gap: 8 mEq/L (ref 3–11)
BUN: 7.7 mg/dL (ref 7.0–26.0)
CALCIUM: 9.3 mg/dL (ref 8.4–10.4)
CO2: 27 mEq/L (ref 22–29)
Chloride: 106 mEq/L (ref 98–109)
Creatinine: 0.8 mg/dL (ref 0.6–1.1)
Glucose: 140 mg/dl (ref 70–140)
POTASSIUM: 4.2 meq/L (ref 3.5–5.1)
SODIUM: 141 meq/L (ref 136–145)
Total Bilirubin: 0.25 mg/dL (ref 0.20–1.20)
Total Protein: 7.5 g/dL (ref 6.4–8.3)

## 2015-01-24 ENCOUNTER — Ambulatory Visit (HOSPITAL_BASED_OUTPATIENT_CLINIC_OR_DEPARTMENT_OTHER): Payer: Managed Care, Other (non HMO) | Admitting: Oncology

## 2015-01-24 ENCOUNTER — Telehealth: Payer: Self-pay | Admitting: Oncology

## 2015-01-24 VITALS — BP 120/66 | HR 87 | Temp 98.3°F | Resp 18 | Ht 64.0 in | Wt 175.4 lb

## 2015-01-24 DIAGNOSIS — C50912 Malignant neoplasm of unspecified site of left female breast: Secondary | ICD-10-CM

## 2015-01-24 DIAGNOSIS — C50919 Malignant neoplasm of unspecified site of unspecified female breast: Secondary | ICD-10-CM

## 2015-01-24 MED ORDER — TAMOXIFEN CITRATE 20 MG PO TABS: 20.0000 mg | ORAL_TABLET | Freq: Every day | ORAL | Status: DC

## 2015-01-24 MED ORDER — PANTOPRAZOLE SODIUM 40 MG PO TBEC: 40.0000 mg | DELAYED_RELEASE_TABLET | Freq: Every day | ORAL | Status: AC

## 2015-01-24 MED ORDER — GABAPENTIN 300 MG PO CAPS: 300.0000 mg | ORAL_CAPSULE | Freq: Every evening | ORAL | Status: DC | PRN

## 2015-01-24 MED ORDER — CITALOPRAM HYDROBROMIDE 20 MG PO TABS: 20.0000 mg | ORAL_TABLET | Freq: Every day | ORAL | Status: DC

## 2015-01-24 MED ORDER — CLONIDINE HCL 0.1 MG/24HR TD PTWK: 0.1000 mg | MEDICATED_PATCH | TRANSDERMAL | Status: DC

## 2015-01-24 NOTE — Progress Notes (Signed)
ID: Linda Kramer   DOB: February 16, 1961  MR#: [id number]  IWP#:[id number]  PCP: Vidal Schwalbe, MD GYN: SU: [Chris Streck] OTHER MD:   CHIEF COMPLAINT: Estrogen receptor positive breast cancer  CURRENT TREATMENT: Tamoxifen  HISTORY OF PRESENT ILLNESS: From her initial intake note:  The patient had her routine screening mammogram on 10/22/2007 which showed a possible mass in the left breast. A left diagnostic mammogram and ultrasound on 11/10/2007 with spot compression views showed a 2.5 cm mass in the upper left breast posteriorly, also palpable. All ultrasound confirmed a 2.6 the regular hypoechoic lesion with an adjacent 1 cm angulated satellite lesion. Biopsy on 11/10/2007 ([phone] and [id number]) showed an invasive ductal carcinoma, ER positive at 87%, PR positive at 12%, HER-2/neu negative, with a very low proliferation fraction of 3%.  Bilateral breast MRIs were obtained 11/18/2007, again noting a 2.3 cm oval mass in the left breast with spiculated margins and a satellite nodule immediately anterior to this. Together, both masses measured 3.4 cm. No abnormality noted in the right breast.  Patient underwent left lumpectomy and sentinel lymph node sampling on 12/08/2007 under the care of Dr. Margot Chimes, with final pathology [id number]) confirming a 3.8 cm invasive ductal carcinoma, grade 2 with 2 of 4 sentinel lymph nodes involved. 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  Patient underwent additional surgery for clear margin in September 2009. Consisted of a left partial mastectomy with removal of the areolar complex. Axillary node dissection was also performed, an additional 6 lymph nodes evaluated, one of which was involved with metastatic ductal carcinoma. At the time of surgery, patient also underwent total abdominal hysterectomy and bilateral salpingo-oophorectomy with benign pathology. [phone])  The patient underwent radiation therapy, after which she  began on tamoxifen in March of 2010. Tamoxifen was continued until August of 2012 at which time the patient switched to letrozole. Letrozole was then discontinued in February 2013 due to arthralgias and myalgias. After a three-month "wash out" period,the  patient started back on tamoxifen in June of 2013.  Her subsequent history is as detailed below  INTERVAL HISTORY: [name] returns today for followup of her estrogen receptor positive breast cancer. She continues on tamoxifen. She tolerates it generally well except for hot flashes. She stopped taking the gabapentin at bedtime so now she is having hot flashes at night which wake her up. She did not find that the venlafaxine worked. She would like to go back to Celexa.  REVIEW OF SYSTEMS: The hot flashes do bother her at work, but she has issues with her job in any case and is looking for a change. Sometimes she has blurred vision. She can be short of breath when she climbs stairs. She has rare headaches. There are not more frequent, or intense than before. She describes herself as depressed. Her mother was just discharged from the hospital after an episode with fluid in her lungs. Her brother had a severe motorcycle accident. Aside from these issues a detailed review of systems today was stable  PAST MEDICAL HISTORY: Past Medical History  Diagnosis Date  . Breast cancer, left breast 04/18/2011  . History of blood transfusion   . Diabetes mellitus without complication   Significant for fibroids, varicose veins, anemia, endometriosis and wisdom teeth removal.  PAST SURGICAL HISTORY: Past Surgical History  Procedure Laterality Date  . Breast lumpectomy  01/20/2008    left  . Breast reduction surgery      right    FAMILY  HISTORY Family History  Problem Relation Age of Onset  . Hypertension Mother   . Arthritis Mother    The patient's father died from a heart attack at age 54. The patient's mother is alive and in her 64s. The patient is  one of 8 siblings, 5 girls and 3 boys. One sister died in an automobile accident remotely. There is no known breast or ovarian cancer in the family  GYNECOLOGIC HISTORY:  [place] P0, status post total abdominal hysterectomy and bilateral salpingo-oophorectomy in August 2009.   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 Her husband, Nicholes Mango, is an Event organiser. They have 2 adopted daughters, Pound and [name], currently 72 and 53. The patient is a Psychologist, forensic.    ADVANCED DIRECTIVES: Not in place  HEALTH MAINTENANCE: Social History  Substance Use Topics  . Smoking status: Never Smoker   . Smokeless tobacco: Never Used  . Alcohol Use: 0.6 oz/week    1 Glasses of wine per week     Colonoscopy: 2012  PAP: UTD  Bone density: DEXA scan May 2012 was normal  Lipid panel: Dr. [name]  No Known Allergies  Current Outpatient Prescriptions  Medication Sig Dispense Refill  . cholecalciferol (VITAMIN D) 400 UNITS TABS Take 2,000 Units by mouth daily.      . citalopram (CELEXA) 40 MG tablet Take 1 tablet (40 mg total) by mouth daily. 90 tablet 1  . fish oil-omega-3 fatty acids 1000 MG capsule Take 1 g by mouth daily.      Marland Kitchen gabapentin (NEURONTIN) 300 MG capsule Take 1 capsule (300 mg total) by mouth at bedtime as needed (hot flashes and post surgical pain). 30 capsule 11  . GARCINIA CAMBOGIA-CHROMIUM PO Take 1,000 mg by mouth daily.    Marland Kitchen ibuprofen (ADVIL,MOTRIN) 200 MG tablet Take 200 mg by mouth every 6 (six) hours as needed.      . metFORMIN (GLUCOPHAGE) 500 MG tablet Take 500 mg by mouth daily. Take 2 tablets with evening meal    . Multiple Vitamin (MULTIVITAMIN) tablet Take 1 tablet by mouth daily.      . tamoxifen (NOLVADEX) 20 MG tablet TAKE 1 TABLET BY MOUTH DAILY. 30 tablet 12   No current facility-administered medications for this visit.    OBJECTIVE: Middle-aged [place] American woman who appears younger than stated  age  ECOG FS: 1  Filed Vitals:   01/24/15 1446  BP: 120/66  Pulse: 87  Temp: 98.3 F (36.8 C)  TempSrc: Oral  Resp: 18  Height: _0  (1.626 m)  Weight: 175 lb 6.4 oz (79.561 kg)  SpO2: 100%  Body mass index is 30.09 kg/(m^2).  Sclerae unicteric, EOMs intact Oropharynx clear and moist No cervical or supraclavicular adenopathy Lungs no rales or rhonchi Heart regular rate and rhythm Abd soft, nontender, positive bowel sounds MSK no focal spinal tenderness, no upper extremity lymphedema Neuro: nonfocal, well oriented, appropriate affect Breasts: The right breast is unremarkable. The left breast is status post mastectomy with reconstruction. There is no evidence of chest wall recurrence. Left axilla is benign.   LAB RESULTS: Lab Results  Component Value Date   WBC 5.3 01/17/2015   NEUTROABS 3.1 01/17/2015   HGB 12.4 01/17/2015   HCT 38.3 01/17/2015   MCV 86.5 01/17/2015   PLT 202 01/17/2015      Chemistry      Component Value Date/Time   NA 141 01/17/2015 1455   NA 141  10/16/2011 1310   K 4.2 01/17/2015 1455   K 4.0 10/16/2011 1310   CL 103 07/09/2012 1500   CL 104 10/16/2011 1310   CO2 27 01/17/2015 1455   CO2 27 10/16/2011 1310   BUN 7.7 01/17/2015 1455   BUN 12 10/16/2011 1310   CREATININE 0.8 01/17/2015 1455   CREATININE 0.83 10/16/2011 1310      Component Value Date/Time   CALCIUM 9.3 01/17/2015 1455   CALCIUM 9.1 10/16/2011 1310   ALKPHOS 110 01/17/2015 1455   ALKPHOS 125* 10/16/2011 1310   AST 15 01/17/2015 1455   AST 16 10/16/2011 1310   ALT 12 01/17/2015 1455   ALT 14 10/16/2011 1310   BILITOT 0.25 01/17/2015 1455   BILITOT 0.4 10/16/2011 1310       Lab Results  Component Value Date   LABCA2 25 07/16/2013    STUDIES: Right mammogram scheduled for next week  ASSESSMENT: 54 y.o. BRCA 1-2 negative [place] woman   1. Status post left lumpectomy and sentinel lymph node biopsy in August 2009 for a T2 N1, stage IIB, invasive ductal  carcinoma, grade 2, estrogen receptor 87% positive, progesterone receptor 12% positive, with an MIB-1 of 3% and no HER-2 amplification   2. 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   3. Received radiation therapy, completed March 2010   4. Started on tamoxifen in March of 2010 and continued until August of 2012.   5. Began on letrozole in August 2012, discontinued February 2013 because of arthralgias/myalgias.  Resumed tamoxifen in June 2013.  6. Status post TAH/BSO in August 2009 with benign pathology   PLAN: Ervin had her definitive surgery 7 years ago, with no evidence of disease recurrence. This is very favorable.  The plan is to continue tamoxifen for a total of 10 years. She is having significant issues with hot flashes unfortunately the venlafaxine was not helpful. She will resume the Celexa as before.  I asked her to go back on gabapentin at bedtime which was working previously. That should help with the nighttime hot flashes. For the daytime hot flashes work in a tried TTS 1. She understands the possible toxicities side effects and complications of this agent. She will let me know if she has any side effects.  To go off the venlafaxine she will take 1 tablet every other day for a week and then simply stop. If she starts the Celexa now there should be no difficulty in that titration  I think it would be helpful for her to see our survivorship nurse practitioner in 6 months. She is going to see me again in October of next year, after next year's mammogram.  She knows to call for any problems that may develop before her next visit here. MAGRINAT,GUSTAV C    01/24/2015

## 2015-01-24 NOTE — Telephone Encounter (Signed)
Appointments made and avs printed for patient °

## 2015-02-01 ENCOUNTER — Ambulatory Visit
Admission: RE | Admit: 2015-02-01 | Discharge: 2015-02-01 | Disposition: A | Discharge status: Home / Self Care | Payer: Managed Care, Other (non HMO) | Source: Ambulatory Visit | Attending: Oncology | Admitting: Oncology

## 2015-02-01 DIAGNOSIS — C50912 Malignant neoplasm of unspecified site of left female breast: Secondary | ICD-10-CM

## 2015-02-01 DIAGNOSIS — Z9889 Other specified postprocedural states: Secondary | ICD-10-CM

## 2015-07-18 ENCOUNTER — Other Ambulatory Visit: Payer: Self-pay | Admitting: *Deleted

## 2015-07-18 DIAGNOSIS — C50912 Malignant neoplasm of unspecified site of left female breast: Secondary | ICD-10-CM

## 2015-07-19 ENCOUNTER — Other Ambulatory Visit (HOSPITAL_BASED_OUTPATIENT_CLINIC_OR_DEPARTMENT_OTHER): Payer: Managed Care, Other (non HMO)

## 2015-07-19 DIAGNOSIS — C50912 Malignant neoplasm of unspecified site of left female breast: Secondary | ICD-10-CM

## 2015-07-19 LAB — CBC WITH DIFFERENTIAL/PLATELET
BASO%: 0.5 % (ref 0.0–2.0)
Basophils Absolute: 0 10*3/uL (ref 0.0–0.1)
EOS%: 0.3 % (ref 0.0–7.0)
Eosinophils Absolute: 0 10*3/uL (ref 0.0–0.5)
HEMATOCRIT: 34.9 % (ref 34.8–46.6)
HGB: 11.1 g/dL — ABNORMAL LOW (ref 11.6–15.9)
LYMPH%: 61.8 % — ABNORMAL HIGH (ref 14.0–49.7)
MCH: 27.5 pg (ref 25.1–34.0)
MCHC: 31.8 g/dL (ref 31.5–36.0)
MCV: 86.6 fL (ref 79.5–101.0)
MONO#: 0.3 10*3/uL (ref 0.1–0.9)
MONO%: 7.8 % (ref 0.0–14.0)
NEUT%: 29.6 % — AB (ref 38.4–76.8)
NEUTROS ABS: 1.1 10*3/uL — AB (ref 1.5–6.5)
Platelets: 140 10*3/uL — ABNORMAL LOW (ref 145–400)
RBC: 4.03 10*6/uL (ref 3.70–5.45)
RDW: 13.2 % (ref 11.2–14.5)
WBC: 3.7 10*3/uL — AB (ref 3.9–10.3)
lymph#: 2.3 10*3/uL (ref 0.9–3.3)

## 2015-07-19 LAB — COMPREHENSIVE METABOLIC PANEL
ALT: 19 U/L (ref 0–55)
AST: 21 U/L (ref 5–34)
Albumin: 3.5 g/dL (ref 3.5–5.0)
Alkaline Phosphatase: 86 U/L (ref 40–150)
Anion Gap: 8 mEq/L (ref 3–11)
BUN: 12.4 mg/dL (ref 7.0–26.0)
CHLORIDE: 105 meq/L (ref 98–109)
CO2: 28 meq/L (ref 22–29)
Calcium: 8.8 mg/dL (ref 8.4–10.4)
Creatinine: 0.9 mg/dL (ref 0.6–1.1)
EGFR: 84 mL/min/{1.73_m2} — ABNORMAL LOW (ref >90)
Glucose: 139 mg/dl (ref 70–140)
Potassium: 3.9 mEq/L (ref 3.5–5.1)
Sodium: 141 mEq/L (ref 136–145)
Total Bilirubin: <0.3 mg/dL (ref 0.20–1.20)
Total Protein: 7.3 g/dL (ref 6.4–8.3)

## 2015-07-26 ENCOUNTER — Ambulatory Visit (HOSPITAL_BASED_OUTPATIENT_CLINIC_OR_DEPARTMENT_OTHER): Payer: Managed Care, Other (non HMO) | Admitting: Nurse Practitioner

## 2015-07-26 ENCOUNTER — Encounter: Payer: Self-pay | Admitting: Nurse Practitioner

## 2015-07-26 ENCOUNTER — Telehealth: Payer: Self-pay | Admitting: Nurse Practitioner

## 2015-07-26 VITALS — BP 135/70 | HR 80 | Temp 98.6°F | Resp 18 | Ht 64.0 in | Wt 175.1 lb

## 2015-07-26 DIAGNOSIS — Z170 Estrogen receptor positive status [ER+]: Secondary | ICD-10-CM | POA: Diagnosis not present

## 2015-07-26 DIAGNOSIS — N951 Menopausal and female climacteric states: Secondary | ICD-10-CM | POA: Diagnosis not present

## 2015-07-26 DIAGNOSIS — C50912 Malignant neoplasm of unspecified site of left female breast: Secondary | ICD-10-CM | POA: Diagnosis not present

## 2015-07-26 NOTE — Patient Instructions (Signed)
Thank you for coming in today!  As we discussed, please continue to perform your self breast exam and report any changes.  If any of those areas that are sore today (that come and go) don't become less sore, please let me know. If you note any new symptoms (please see below), be sure to notify us ASAP.  Your mammogram will be due in September 2017 and we will enter orders for it today.  We'll have you return to see Dr. Jana Hakim in October 2017 and have you come back to see me in one year's time.  Looking forward to working with you in the future!  Let us know if you have any questions!  Symptoms to Watch for and Report to Your Provider  . Return of the cancer symptoms you had before- such as a lump or new growth where your cancer first started . New or unusual pain that seems unrelated to an injury and does not go away, including back pain or bone pain . Weight loss without trying/intending . Unexplained bleeding . A rash or allergic reaction, such as swelling, severe itching or wheezing . Chills or fevers . Persistent headaches . Shortness of breath or difficulty breathing . Bloody stools or blood in your urine . Lumps, bumps, swelling and/or nipple discharge . Nausea, vomiting, diarrhea, loss of appetite, or trouble swallowing . A cough that doesn't go away . Abdominal pain . Swelling in your arms or legs . Fractures . Hot flashes or other menopausal symptoms . Any other signs mentioned by your doctor or nurse or any unusual symptoms                 that you just can't explain   NOTE: Just because you have certain symptoms, it doesn't mean the cancer has come back or you have a new cancer. Symptoms can be due to other problems that need to be addressed.  It is important to watch for these symptoms and report them to your provider so you can be medically evaluated for any of these concerns!    Living a Life of Wellness After Cancer:  *Note: Please consult your health care provider before  using any medications, supplements, over-the-counter products, or other interventions.  Also, please consult your primary care provider before you begin any lifestyle program (diet, exercise, etc.).  Your safety is our top priority and we want to make sure you continue to live a long and healthy life!    Healthy Lifestyle Recommendations  As a cancer survivor, it is important develop a lifelong commitment to a healthy lifestyle. A healthy lifestyle can prevent cancer from returning as well as prevent other diseases like heart disease, diabetes and high blood pressure.  These are some things that you can do to have a healthy lifestyle:  Marland Kitchen Maintain a healthy weight.  . Exercise daily per your doctor's orders. . Eat a balanced diet high in fruits, vegetables, bran, and fiber. Limit intake of red meat      and processed foods.  . Limit how much alcohol you consume, if at all. Ali Lowe regular bone mineral density testing for osteoporosis.  . Talk to your doctor about cardiovascular disease or "heart disease" screening. . Stop smoking (if you smoke). . Know your family history. . Be mindful of your emotional, social, and spiritual needs. . Meet regularly with a Primary Care Provider (PCP). Find a PCP if you do not  already have one. . Talk to your doctor about regular cancer screening including screening for colon           cancer, GYN cancers, and skin cancer.

## 2015-07-26 NOTE — Telephone Encounter (Signed)
Gave and printed appt sched an davs for pt for March 2018

## 2015-07-26 NOTE — Progress Notes (Signed)
CLINIC:  Cancer Survivorship   REASON FOR VISIT:  Routine follow-up post-treatment for history of breast cancer.  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 Overall, Linda Kramer reports feeling doing well since her last visit. She continues on tamoxifen and is tolerating this fairly well. She continues with hot flashes, but has not yet begun her gabapentin as she discussed with Dr. Jana Hakim at her last visit.  She denies any shortness of breath, chest pain, calf pain /swelling or vaginal bleeding.  She has some scattered areas of soreness in her bilateral breasts which "come and go."  She denies mass or lesion.  She denies any headache.  She has joint pain, which is unchanged. She has alternating diarrhea / constipation intermittently.  Denies abdominal pain or fever. She reports a good appetite and denies any weight loss.  She has intermittent fatigue.  She is not exercising routinely.  REVIEW OF SYSTEMS:  General: Hot flashes and fatigue as above. Denies fever, chills, unintentional weight loss, or night sweats.  HEENT: Wears glasses. Recovering from recent cold.  Denies visual changes, hearing loss, mouth sores, or difficulty swallowing. Cardiac: Denies palpitations and lower extremity edema.    Respiratory: Denies wheeze or dyspnea on exertion.  Breast: As above. GI: Occasional diarrhea and constipation, as above. Heartburn recently placed on protonix. GU: Denies dysuria, hematuria, vaginal bleeding, vaginal discharge, or vaginal dryness.  Musculoskeletal: Joint pain as above.   Neuro: Denies recent fall or numbness / tingling in her extremities.  Skin: Denies rash, pruritis, or open wounds.  Psych: Occasional anxiety.  Depression if she does not take her medication.  Denies insomnia or memory loss.   A 14-point review of systems was completed and was negative, except as noted above.   ONCOLOGY TREATMENT TEAM:  1. Surgeon:  Dr. Margot Chimes at [facility]  2. Medical Oncologist: Dr. Jana Hakim 3. Radiation Oncologist: Dr. Sondra Come    PAST MEDICAL/SURGICAL HISTORY:  Past Medical History  Diagnosis Date  . Breast cancer, left breast ([place]) 04/18/2011  . History of blood transfusion   . Diabetes mellitus without complication [facility])    Past Surgical History  Procedure Laterality Date  . Breast lumpectomy  01/20/2008    left  . Breast reduction surgery      right     ALLERGIES:  No Known Allergies   CURRENT MEDICATIONS:  Current Outpatient Prescriptions on File Prior to Visit  Medication Sig Dispense Refill  . cholecalciferol (VITAMIN D) 400 UNITS TABS Take 2,000 Units by mouth daily.      . citalopram (CELEXA) 20 MG tablet Take 1 tablet (20 mg total) by mouth daily. 90 tablet 3  . fish oil-omega-3 fatty acids 1000 MG capsule Take 1 g by mouth daily.      Marland Kitchen gabapentin (NEURONTIN) 300 MG  capsule Take 1 capsule (300 mg total) by mouth at bedtime as needed (hot flashes and post surgical pain). 30 capsule 11  . ibuprofen (ADVIL,MOTRIN) 200 MG tablet Take 200 mg by mouth every 6 (six) hours as needed.      . metFORMIN (GLUCOPHAGE) 500 MG tablet Take 500 mg by mouth daily. Take 2 tablets with evening meal    . Multiple Vitamin (MULTIVITAMIN) tablet Take 1 tablet by  mouth daily.      . tamoxifen (NOLVADEX) 20 MG tablet Take 1 tablet (20 mg total) by mouth daily. 90 tablet 12  . GARCINIA CAMBOGIA-CHROMIUM PO Take 1,000 mg by mouth daily. Reported on 07/26/2015    . pantoprazole (PROTONIX) 40 MG tablet Take 1 tablet (40 mg total) by mouth daily. (Patient not taking: Reported on 07/26/2015) 90 tablet 3   No current facility-administered medications on file prior to visit.     ONCOLOGIC FAMILY HISTORY:  Family History  Problem Relation Age of Onset  . Hypertension Mother   . Arthritis Mother      GENETIC COUNSELING/TESTING: BRCA1/2 negative.  SOCIAL HISTORY:  Linda Kramer is married and lives with her family in [place], [place].  She has 2 adopted children. 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  She drinks one glass of alcohol / week.  HEALTH MAINTENANCE: Colonoscopy: 2014 PAP: 2016 Bone density: 2012  PHYSICAL EXAMINATION:  Vital Signs: Filed Vitals:   07/26/15 1458  BP: 135/70  Pulse: 80  Temp: 98.6 F (37 C)  Resp: 18   Weight: 175.1 (stable) ECOG performance status: 0 General: Well-nourished, well-appearing female in no acute distress.  She is unaccompanied in clinic today.   HEENT: Head is atraumatic and normocephalic.  Pupils equal and reactive to light and accomodation. Conjunctivae clear without exudate.  Sclerae anicteric. Oral mucosa is pink, moist, and intact without lesions.  Oropharynx is pink without lesions or erythema.  Lymph: No cervical, supraclavicular, infraclavicular, or axillary lymphadenopathy noted on palpation.  Cardiovascular: Regular rate and rhythm without murmurs, rubs, or gallops. Respiratory: Clear to auscultation bilaterally. Chest expansion symmetric without accessory muscle use on inspiration or expiration.  Breast: Bilateral breast exam performed. Lumpectomy scar along left breast  intact without nodularity. Scattered fibroglandular densities across bilateral breasts, slightly tender to palpation. No worrisome mass or nodularity. GI: Abdomen soft and round. No tenderness to palpation. Bowel sounds normoactive in 4 quadrants. No hepatosplenomegaly.   GU: Deferred.  Musculoskeletal: Muscle strength 5/5 in all extremities.   Neuro: No focal deficits. Steady gait.  Psych: Mood and affect normal and appropriate for situation.  Extremities: No edema, cyanosis, or clubbing.  Skin: Warm and dry. No open lesions noted.   LABORATORY DATA:  Recent Results (from the past 2160 hour(s))  CBC with Differential     Status: Abnormal   Collection Time: 07/19/15  2:28 PM  Result Value Ref Range   WBC 3.7 (L) 3.9 - 10.3 10e3/uL   NEUT# 1.1 (L) 1.5 - 6.5 10e3/uL   HGB 11.1 (L) 11.6 - 15.9 g/dL   HCT 34.9 34.8 - 46.6 %   Platelets 140 (L) 145 - 400 10e3/uL   MCV 86.6 79.5 - 101.0 fL   MCH 27.5 25.1 - 34.0 pg   MCHC 31.8 31.5 - 36.0 g/dL   RBC 4.03 3.70 - 5.45 10e6/uL   RDW 13.2 11.2 - 14.5 %  lymph# 2.3 0.9 - 3.3 10e3/uL   MONO# 0.3 0.1 - 0.9 10e3/uL   Eosinophils Absolute 0.0 0.0 - 0.5 10e3/uL   Basophils Absolute 0.0 0.0 - 0.1 10e3/uL   NEUT% 29.6 (L) 38.4 - 76.8 %   LYMPH% 61.8 (H) 14.0 - 49.7 %   MONO% 7.8 0.0 - 14.0 %   EOS% 0.3 0.0 - 7.0 %   BASO% 0.5 0.0 - 2.0 %  Comprehensive metabolic panel     Status: Abnormal   Collection Time: 07/19/15  2:28 PM  Result Value Ref Range   Sodium 141 136 - 145 mEq/L   Potassium 3.9 3.5 - 5.1 mEq/L   Chloride 105 98 - 109 mEq/L   CO2 28 22 - 29 mEq/L   Glucose 139 70 - 140 mg/dl    Comment: Glucose reference range is for nonfasting patients. Fasting glucose reference range is 70- 100.   BUN 12.4 7.0 - 26.0 mg/dL   Creatinine 0.9 0.6 - 1.1 mg/dL   Total Bilirubin <0.30 0.20 - 1.20 mg/dL   Alkaline Phosphatase 86 40 - 150 U/L   AST 21 5 - 34 U/L   ALT 19 0 - 55 U/L   Total Protein 7.3 6.4 - 8.3 g/dL   Albumin 3.5 3.5 - 5.0  g/dL   Calcium 8.8 8.4 - 10.4 mg/dL   Anion Gap 8 3 - 11 mEq/L   EGFR 84 (L) >90 ml/min/1.73 m2    Comment: eGFR is calculated using the CKD-EPI Creatinine Equation (2009)       ASSESSMENT AND PLAN:   1. History of breast cancer: Stage IIB invasive ductal carcinoma of the left/right breast (12/2007), ER positive, PR positive, HER2/neu negative, S/P lumpectomy/SLNB, chemotherapy, and radiation therapy to the left breast, now on adjvuant endocrine therapy with tamoxifen with plans to complete 10 years of therapy in 2020.  Linda Kramer is doing well with no clinical symptoms worrisome for cancer recurrence at this time. I have reviewed the recommendations for ongoing surveillance with her and she will follow-up with her medical oncologist,  Dr. Jana Hakim, in October 2017 with history and physical exam per surveillance protocol.  She will return to see me in one year's time.  She will be due mammogram in September 2017 and I have entered orders for that to be scheduled following today's visit. She will continue her anti-estrogen therapy with tamoxifen at this time. She was instructed to make [place] aware if she notes any change within her breast, any new symptoms such as pain, shortness of breath, weight loss, or fatigue.  She will also report any new or increased side effects of the endocrine therapy or any difficulties with it.  Though the incidence is low, there is an associated risk of endometrial cancer with anti-estrogen therapies like Tamoxifen.  Linda Kramer was encouraged to contact Dr. Jana Hakim or myself with any vaginal bleeding while taking Tamoxifen. Other side effects of Tamoxifen were again reviewed with her as well.  2. Hot flashes: Linda Kramer is not taking her gabapentin at this time.  We have discussed the mechanism of action and its side effects, and I have encouraged her to begin it to help manage the hot flashes.  Particularly, as they are worse in the summer months which are quickly approaching.   She will begin taking it and report if it is ineffective or she develops any untoward side effects. I have cautioned her that it may initially make her sleepy, but this will improve.  3. Cancer screening:  Due to Linda Kramer's history and her age, she should receive screening for skin cancers, colon cancer, and gynecologic cancers.  The information and recommendations were shared with the patient and in her written after visit summary.  4. Health maintenance and wellness promotion:Linda Kramer and I discussed recommendations to maximize nutrition and minimize recurrence, such as increased intake of fruits, vegetables, lean proteins, and minimizing the intake of red meats and processed foods.  She was also encouraged to engage in moderate to vigorous exercise for 30 minutes per day most days of the week. She was instructed to limit her alcohol consumption and continue to abstain from tobacco use.   A total of 30 minutes of face-to-face time was spent with this patient with greater than 50% of that time in counseling and care-coordination.   Sylvan Cheese, NP  Survivorship Program [facility] [phone]   Note: PRIMARY CARE PROVIDER Vidal Schwalbe, [place] [phone]

## 2015-12-12 ENCOUNTER — Ambulatory Visit
Admission: RE | Admit: 2015-12-12 | Discharge: 2015-12-12 | Disposition: A | Discharge status: Home / Self Care | Payer: Managed Care, Other (non HMO) | Source: Ambulatory Visit | Attending: Family Medicine | Admitting: Family Medicine

## 2015-12-12 ENCOUNTER — Other Ambulatory Visit: Payer: Self-pay | Admitting: Family Medicine

## 2015-12-12 DIAGNOSIS — S63641A Sprain of metacarpophalangeal joint of right thumb, initial encounter: Secondary | ICD-10-CM

## 2016-02-03 ENCOUNTER — Other Ambulatory Visit: Payer: Self-pay | Admitting: Nurse Practitioner

## 2016-02-03 DIAGNOSIS — Z1231 Encounter for screening mammogram for malignant neoplasm of breast: Secondary | ICD-10-CM

## 2016-02-03 DIAGNOSIS — Z853 Personal history of malignant neoplasm of breast: Secondary | ICD-10-CM

## 2016-02-09 ENCOUNTER — Ambulatory Visit
Admission: RE | Admit: 2016-02-09 | Discharge: 2016-02-09 | Disposition: A | Discharge status: Home / Self Care | Payer: Managed Care, Other (non HMO) | Source: Ambulatory Visit | Attending: Nurse Practitioner | Admitting: Nurse Practitioner

## 2016-02-09 DIAGNOSIS — Z1231 Encounter for screening mammogram for malignant neoplasm of breast: Secondary | ICD-10-CM

## 2016-02-09 DIAGNOSIS — Z853 Personal history of malignant neoplasm of breast: Secondary | ICD-10-CM

## 2016-02-20 ENCOUNTER — Other Ambulatory Visit (HOSPITAL_BASED_OUTPATIENT_CLINIC_OR_DEPARTMENT_OTHER): Payer: Managed Care, Other (non HMO)

## 2016-02-20 ENCOUNTER — Other Ambulatory Visit: Payer: Self-pay | Admitting: *Deleted

## 2016-02-20 DIAGNOSIS — C50912 Malignant neoplasm of unspecified site of left female breast: Secondary | ICD-10-CM

## 2016-02-20 DIAGNOSIS — C50012 Malignant neoplasm of nipple and areola, left female breast: Secondary | ICD-10-CM

## 2016-02-20 LAB — CBC WITH DIFFERENTIAL/PLATELET
BASO%: 0.4 % (ref 0.0–2.0)
Basophils Absolute: 0 10*3/uL (ref 0.0–0.1)
EOS%: 1 % (ref 0.0–7.0)
Eosinophils Absolute: 0.1 10*3/uL (ref 0.0–0.5)
HCT: 36.8 % (ref 34.8–46.6)
HEMOGLOBIN: 11.7 g/dL (ref 11.6–15.9)
LYMPH%: 43.2 % (ref 14.0–49.7)
MCH: 27.7 pg (ref 25.1–34.0)
MCHC: 31.6 g/dL (ref 31.5–36.0)
MCV: 87.7 fL (ref 79.5–101.0)
MONO#: 0.4 10*3/uL (ref 0.1–0.9)
MONO%: 6.7 % (ref 0.0–14.0)
NEUT%: 48.7 % (ref 38.4–76.8)
NEUTROS ABS: 2.9 10*3/uL (ref 1.5–6.5)
Platelets: 193 10*3/uL (ref 145–400)
RBC: 4.2 10*6/uL (ref 3.70–5.45)
RDW: 14.2 % (ref 11.2–14.5)
WBC: 6 10*3/uL (ref 3.9–10.3)
lymph#: 2.6 10*3/uL (ref 0.9–3.3)

## 2016-02-20 LAB — COMPREHENSIVE METABOLIC PANEL
ALT: 12 U/L (ref 0–55)
AST: 16 U/L (ref 5–34)
Albumin: 3.5 g/dL (ref 3.5–5.0)
Alkaline Phosphatase: 102 U/L (ref 40–150)
Anion Gap: 8 mEq/L (ref 3–11)
BUN: 11.2 mg/dL (ref 7.0–26.0)
CO2: 28 mEq/L (ref 22–29)
Calcium: 9.1 mg/dL (ref 8.4–10.4)
Chloride: 106 mEq/L (ref 98–109)
Creatinine: 0.8 mg/dL (ref 0.6–1.1)
GLUCOSE: 94 mg/dL (ref 70–140)
Potassium: 4.2 mEq/L (ref 3.5–5.1)
SODIUM: 141 meq/L (ref 136–145)
TOTAL PROTEIN: 7.3 g/dL (ref 6.4–8.3)

## 2016-02-27 ENCOUNTER — Ambulatory Visit (HOSPITAL_BASED_OUTPATIENT_CLINIC_OR_DEPARTMENT_OTHER): Payer: Managed Care, Other (non HMO) | Admitting: Oncology

## 2016-02-27 ENCOUNTER — Telehealth: Payer: Self-pay | Admitting: Oncology

## 2016-02-27 VITALS — BP 127/71 | HR 79 | Temp 98.3°F | Resp 18 | Ht 64.0 in | Wt 167.5 lb

## 2016-02-27 DIAGNOSIS — Z79810 Long term (current) use of selective estrogen receptor modulators (SERMs): Secondary | ICD-10-CM | POA: Diagnosis not present

## 2016-02-27 DIAGNOSIS — Z170 Estrogen receptor positive status [ER+]: Secondary | ICD-10-CM

## 2016-02-27 DIAGNOSIS — C50912 Malignant neoplasm of unspecified site of left female breast: Secondary | ICD-10-CM | POA: Diagnosis not present

## 2016-02-27 NOTE — Progress Notes (Signed)
ID: Linda Kramer   DOB: 11/02/1960  MR#: [id number]  BJS#:[id number]  PCP: Vidal Schwalbe, MD GYN: SU: [Chris Streck] OTHER MD:   CHIEF COMPLAINT: Estrogen receptor positive breast cancer  CURRENT TREATMENT: Tamoxifen  HISTORY OF PRESENT ILLNESS: From her initial intake note:  The patient had her routine screening mammogram on 10/22/2007 which showed a possible mass in the left breast. A left diagnostic mammogram and ultrasound on 11/10/2007 with spot compression views showed a 2.5 cm mass in the upper left breast posteriorly, also palpable. All ultrasound confirmed a 2.6 the regular hypoechoic lesion with an adjacent 1 cm angulated satellite lesion. Biopsy on 11/10/2007 [phone] and [id number]) showed an invasive ductal carcinoma, ER positive at 87%, PR positive at 12%, HER-2/neu negative, with a very low proliferation fraction of 3%.  Bilateral breast MRIs were obtained 11/18/2007, again noting a 2.3 cm oval mass in the left breast with spiculated margins and a satellite nodule immediately anterior to this. Together, both masses measured 3.4 cm. No abnormality noted in the right breast.  Patient underwent left lumpectomy and sentinel lymph node sampling on 12/08/2007 under the care of Dr. Margot Chimes, with final pathology [id number]) confirming a 3.8 cm invasive ductal carcinoma, grade 2 with 2 of 4 sentinel lymph nodes involved. 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  Patient underwent additional surgery for clear margin in September 2009. Consisted of a left partial mastectomy with removal of the areolar complex. Axillary node dissection was also performed, an additional 6 lymph nodes evaluated, one of which was involved with metastatic ductal carcinoma. At the time of surgery, patient also underwent total abdominal hysterectomy and bilateral salpingo-oophorectomy with benign pathology. [phone])  The patient underwent radiation therapy, after which she  began on tamoxifen in March of 2010. Tamoxifen was continued until August of 2012 at which time the patient switched to letrozole. Letrozole was then discontinued in February 2013 due to arthralgias and myalgias. After a three-month "wash out" period,the  patient started back on tamoxifen in June of 2013.  Her subsequent history is as detailed below  INTERVAL HISTORY: Amorie returns today for followup of her breast cancer. She continues on tamoxifen, generally with good tolerance. She does still have hot flashes although they don't tend to wake her up. She obtains it at an excellent price.  REVIEW OF SYSTEMS: She tells me her vision is blurry and that she needs to get her eyes rechecked. She is not aware of having cataracts. She does have significant indigestion and reflux problems. She has been prescribed Protonix for this but is not taking it regularly. She has some joint pains here and there which are not more intense or persistent than before. Plantar fasciitis is also the same. She is not aware of how she is doing with her sugars but tells me she was recently started on any medication for that. Aside from those issues a detailed review of systems today was stable  PAST MEDICAL HISTORY: Past Medical History:  Diagnosis Date  . Breast cancer, left breast ([facility]) 04/18/2011  . Diabetes mellitus without complication ([facility])   . History of blood transfusion   Significant for fibroids, varicose veins, anemia, endometriosis and wisdom teeth removal.  PAST SURGICAL HISTORY: Past Surgical History:  Procedure Laterality Date  . BREAST LUMPECTOMY  01/20/2008   left  . BREAST REDUCTION SURGERY     right    FAMILY HISTORY Family History  Problem Relation Age of Onset  . Hypertension  Mother   . Arthritis Mother    The patient's father died from a heart attack at age 80. The patient's mother is alive and in her 22s. The patient is one of 8 siblings, 5 girls and 3 boys. One sister died in an  automobile accident remotely. There is no known breast or ovarian cancer in the family  GYNECOLOGIC HISTORY:  Byars P0, status post total abdominal hysterectomy and bilateral salpingo-oophorectomy in August 2009.   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 Her husband, Nicholes Mango, is an Event organiser. They have 2 adopted daughters, [place] and [place], currently 15 and 9. The patient is a Psychologist, forensic.    ADVANCED DIRECTIVES: Not in place  HEALTH MAINTENANCE: Social History  Substance Use Topics  . Smoking status: Never Smoker  . Smokeless tobacco: Never Used  . Alcohol use 0.6 oz/week    1 Glasses of wine per week     Colonoscopy: 2012  PAP: UTD  Bone density: DEXA scan May 2012 was normal  Lipid panel: Dr. [name]  No Known Allergies  Current Outpatient Prescriptions  Medication Sig Dispense Refill  . Dapagliflozin-Metformin HCl ER 10-500 MG TB24 Take by mouth.    . cholecalciferol (VITAMIN D) 400 UNITS TABS Take 2,000 Units by mouth daily.      . citalopram (CELEXA) 20 MG tablet Take 1 tablet (20 mg total) by mouth daily. 90 tablet 3  . fish oil-omega-3 fatty acids 1000 MG capsule Take 1 g by mouth daily.      Marland Kitchen gabapentin (NEURONTIN) 300 MG capsule Take 1 capsule (300 mg total) by mouth at bedtime as needed (hot flashes and post surgical pain). 30 capsule 11  . GARCINIA CAMBOGIA-CHROMIUM PO Take 1,000 mg by mouth daily. Reported on 07/26/2015    . ibuprofen (ADVIL,MOTRIN) 200 MG tablet Take 200 mg by mouth every 6 (six) hours as needed.      . metFORMIN (GLUCOPHAGE) 500 MG tablet Take 500 mg by mouth daily. Take 2 tablets with evening meal    . Multiple Vitamin (MULTIVITAMIN) tablet Take 1 tablet by mouth daily.      . pantoprazole (PROTONIX) 40 MG tablet Take 1 tablet (40 mg total) by mouth daily. (Patient not taking: Reported on 07/26/2015) 90 tablet 3  . tamoxifen (NOLVADEX) 20 MG tablet Take 1 tablet (20 mg  total) by mouth daily. 90 tablet 12   No current facility-administered medications for this visit.     OBJECTIVE: Middle-aged [place] American woman who appears well  ECOG FS: 1  Vitals:   02/27/16 1540  BP: 127/71  BP Location: Right Arm  Patient Position: Sitting  Pulse: 79  Resp: 18  Temp: 98.3 F (36.8 C)  TempSrc: Oral  SpO2: 100%  Weight: 167 lb 8 oz (76 kg)  Height: _0  (1.626 m)  Body mass index is 28.75 kg/m.   Sclerae unicteric, pupils round and equal Oropharynx clear and moist-- no thrush or other lesions No cervical or supraclavicular adenopathy Lungs no rales or rhonchi Heart regular rate and rhythm Abd soft, nontender, positive bowel sounds MSK no focal spinal tenderness, no upper extremity lymphedema Neuro: nonfocal, well oriented, appropriate affect Breasts: The right breast is benign. The left breast is status post mastectomy with reconstruction. There is no evidence of local recurrence. The left axilla is benign.  LAB RESULTS: Lab Results  Component Value Date   WBC 6.0 02/20/2016   NEUTROABS 2.9 02/20/2016  HGB 11.7 02/20/2016   HCT 36.8 02/20/2016   MCV 87.7 02/20/2016   PLT 193 02/20/2016      Chemistry      Component Value Date/Time   NA 141 02/20/2016 1435   K 4.2 02/20/2016 1435   CL 103 07/09/2012 1500   CO2 28 02/20/2016 1435   BUN 11.2 02/20/2016 1435   CREATININE 0.8 02/20/2016 1435      Component Value Date/Time   CALCIUM 9.1 02/20/2016 1435   ALKPHOS 102 02/20/2016 1435   AST 16 02/20/2016 1435   ALT 12 02/20/2016 1435   BILITOT <0.22 02/20/2016 1435       Lab Results  Component Value Date   LABCA2 25 07/16/2013    STUDIES: Mm Screening Breast Tomo Bilateral  Result Date: 02/15/2016 CLINICAL DATA:  Screening. EXAM: 2D DIGITAL SCREENING BILATERAL MAMMOGRAM WITH CAD AND ADJUNCT TOMO COMPARISON:  Previous exam(s). ACR Breast Density Category b: There are scattered areas of fibroglandular density. FINDINGS: There  are no findings suspicious for malignancy. Images were processed with CAD. IMPRESSION: No mammographic evidence of malignancy. A result letter of this screening mammogram will be mailed directly to the patient. RECOMMENDATION: Screening mammogram in one year. (Code:SM-B-01Y) BI-RADS CATEGORY  1: Negative. Electronically Signed   By: Lovey Newcomer M.D.   On: 02/15/2016 07:21     ASSESSMENT: 55 y.o. BRCA 1-2 negative [place] woman   1. Status post left lumpectomy and sentinel lymph node biopsy in August 2009 for a T2 N1, stage IIB, invasive ductal carcinoma, grade 2, estrogen receptor 87% positive, progesterone receptor 12% positive, with an MIB-1 of 3% and no HER-2 amplification   2. 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   3. Received radiation therapy, completed March 2010   4. Started on tamoxifen in March of 2010 and continued until August of 2012.   5. Began on letrozole in August 2012, discontinued February 2013 because of arthralgias/myalgias.  Resumed tamoxifen in June 2013.  6. Status post TAH/BSO in August 2009 with benign pathology   PLAN: Shaquavia is now 8 years out from definitive surgery for her breast cancer with no evidence of disease recurrence. This is very favorable.  The plan is to continue tamoxifen for an additional 2 years. I will see her in a yearly basis until then.  I suggested she take her Protonix daily as prescribed for at least a month and see that does not clear the [place]    02/27/2016

## 2016-02-27 NOTE — Telephone Encounter (Signed)
Gave patient avs report and appointments for March (LTS) and October lab/GM.

## 2016-07-19 ENCOUNTER — Telehealth: Payer: Self-pay | Admitting: Adult Health

## 2016-07-19 ENCOUNTER — Ambulatory Visit (HOSPITAL_BASED_OUTPATIENT_CLINIC_OR_DEPARTMENT_OTHER): Payer: Managed Care, Other (non HMO) | Admitting: Adult Health

## 2016-07-19 ENCOUNTER — Encounter: Payer: Self-pay | Admitting: Adult Health

## 2016-07-19 VITALS — BP 111/68 | HR 81 | Temp 97.8°F | Resp 18 | Ht 64.0 in | Wt 167.2 lb

## 2016-07-19 DIAGNOSIS — Z170 Estrogen receptor positive status [ER+]: Secondary | ICD-10-CM | POA: Diagnosis not present

## 2016-07-19 DIAGNOSIS — Z79810 Long term (current) use of selective estrogen receptor modulators (SERMs): Secondary | ICD-10-CM

## 2016-07-19 DIAGNOSIS — Z1239 Encounter for other screening for malignant neoplasm of breast: Secondary | ICD-10-CM

## 2016-07-19 DIAGNOSIS — C50912 Malignant neoplasm of unspecified site of left female breast: Secondary | ICD-10-CM | POA: Diagnosis not present

## 2016-07-19 NOTE — Progress Notes (Signed)
CLINIC:  Survivorship   REASON FOR VISIT:  Routine follow-up for history of breast cancer.   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  Overall, she reports feeling quite well.  She is not having any issues with Tamoxifen.  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  She has had TAH/BSO.  She has occasional hot flashes.     REVIEW OF SYSTEMS:  Review of Systems  Constitutional: Negative for chills, fever, malaise/fatigue and weight loss.  HENT: Negative for hearing loss and tinnitus.   Eyes: Negative for blurred vision and double vision.  Respiratory: Negative for cough and shortness of breath.   Cardiovascular: Negative for chest pain, palpitations and leg swelling.  Gastrointestinal: Negative for abdominal pain, blood in stool, constipation, diarrhea, heartburn, melena, nausea and vomiting.  Genitourinary: Negative for dysuria.  Musculoskeletal: Negative for myalgias.  Skin: Negative for rash.  Neurological: Positive for tingling. Negative for dizziness and headaches.  Endo/Heme/Allergies: Negative for environmental allergies. Does not bruise/bleed easily.  Psychiatric/Behavioral: Negative for depression. The patient is not  nervous/anxious.         Breast: Denies any new nodularity, masses, tenderness, nipple changes, or nipple discharge.        PAST MEDICAL/SURGICAL HISTORY:  Past Medical History:  Diagnosis Date  . Breast cancer, left breast (Linda Kramer) 04/18/2011  . Diabetes mellitus without complication (Linda Kramer)   . History of blood transfusion    Past Surgical History:  Procedure Laterality Date  . BREAST LUMPECTOMY  01/20/2008   left  . BREAST REDUCTION SURGERY     right     ALLERGIES:  No Known Allergies   CURRENT MEDICATIONS:  Outpatient Encounter Prescriptions as of 07/19/2016  Medication Sig  . cholecalciferol (VITAMIN D) 400 UNITS TABS Take 2,000 Units by mouth daily.    . citalopram (CELEXA) 40 MG tablet Take 40 mg by mouth daily.  . Dapagliflozin-Metformin HCl ER 10-500 MG TB24 Take by mouth.  . fish oil-omega-3 fatty acids 1000 MG capsule Take 1 g by mouth daily.    Marland Kitchen GARCINIA CAMBOGIA-CHROMIUM PO Take 1,000 mg by mouth daily. Reported on 07/26/2015  . ibuprofen (ADVIL,MOTRIN) 200 MG tablet Take 200 mg by mouth every 6 (six) hours as needed.    . Multiple Vitamin (MULTIVITAMIN) tablet Take 1 tablet by mouth daily.    . pantoprazole (PROTONIX) 40 MG tablet Take 1 tablet (40 mg total) by mouth daily.  . tamoxifen (NOLVADEX) 20 MG tablet Take 1 tablet (20 mg total) by mouth daily.  . citalopram (CELEXA) 20 MG tablet Take 1 tablet (20 mg total) by mouth daily. (Patient not taking:  Reported on 07/19/2016)  . gabapentin (NEURONTIN) 300 MG capsule Take 1 capsule (300 mg total) by mouth at bedtime as needed (hot flashes and post surgical pain).  . metFORMIN (GLUCOPHAGE) 500 MG tablet Take 500 mg by mouth daily. Take 2 tablets with evening meal   No facility-administered encounter medications on file as of 07/19/2016.      ONCOLOGIC FAMILY HISTORY:  Family History  Problem Relation Age of Onset  . Hypertension Mother   . Arthritis Mother      SOCIAL HISTORY:  Linda Kramer is  married and lives with her husband in [place], [place].  .  Linda Kramer is currently working full time.  She denies any current or history of tobacco, alcohol, or illicit drug use.     PHYSICAL EXAMINATION:  Vital Signs: Vitals:   07/19/16 1226  BP: 111/68  Pulse: 81  Resp: 18  Temp: 97.8 F (36.6 C)   Filed Weights   07/19/16 1226  Weight: 167 lb 3.2 oz (75.8 kg)   General: Well-nourished, well-appearing female in no acute distress.  Unaccompanied today.   HEENT: Head is normocephalic.  Pupils equal and reactive to light. Conjunctivae clear without exudate.  Sclerae anicteric. Oral mucosa is pink, moist.  Oropharynx is pink without lesions or erythema.  Lymph: No cervical, supraclavicular, or infraclavicular lymphadenopathy noted on palpation.  Cardiovascular: Regular rate and rhythm.Marland Kitchen Respiratory: Clear to auscultation bilaterally. Chest expansion symmetric; breathing non-labored.  Breast Exam:  -Left breast: No appreciable masses on palpation. No skin redness, thickening, or peau d'orange appearance; no nipple retraction or nipple discharge; mild distortion in symmetry at previous lumpectomy site well healed scar without erythema or nodularity.  -Right breast: No appreciable masses on palpation. No skin redness, thickening, or peau d'orange appearance; no nipple retraction or nipple discharge; -Axilla: No axillary adenopathy bilaterally.  GI: Abdomen soft and round; non-tender, non-distended. Bowel sounds normoactive. No hepatosplenomegaly.   GU: Deferred.  Neuro: No focal deficits. Steady gait.  Psych: Mood and affect normal and appropriate for situation.  Extremities: No edema. Skin: Warm and dry.  LABORATORY DATA:  None for this visit   DIAGNOSTIC IMAGING:  Most recent mammogram:      ASSESSMENT AND PLAN:  Ms.. Kramer is a pleasant 56 y.o. female with history of Stage IA left breast invasive ductal carcinoma, ER+/PR+/HER2-, diagnosed in 2009, treated with  lumpectomy, adjuvant chemotherapy adjuvant radiation therapy, and anti-estrogen therapy with letrozole and tamoxifen.  She [facility] for surveillance and routine follow-up.   1. History of breast cancer:  Linda Kramer is currently clinically and radiographically without evidence of disease or recurrence of breast cancer. She will be due for mammogram in 02/2017; orders placed today.  She will continue her anti-estrogen therapy with Tamoxifen, with plans to continue for 10 years.  She will return to the cancer center to see her medical oncologist, Dr. Jana Hakim, in 02/2017.  I encouraged her to call me with any questions or concerns before her next visit at the cancer center, and I would be happy to see her sooner, if needed.    2. Cancer screening:  Due to Ms. Nicolosi's history and her age, she should receive screening for skin cancers, colon cancer. She was encouraged to follow-up with her PCP for appropriate cancer screenings.   3. Health maintenance and wellness promotion: Ms. Dismuke was encouraged to consume 5-7 servings of fruits and vegetables per day. She was also encouraged to engage in moderate to vigorous  exercise for 30 minutes per day most days of the week. She was instructed to limit her alcohol consumption and continue to abstain from tobacco use.    Dispo:  -Return to cancer center in 02/2017 for appt with Dr. Jana Hakim  -Return for survivorship appt in one year -mammogram ordered for 02/2017   A total of (30) minutes of face-to-face time was spent with this patient with greater than 50% of that time in counseling and care-coordination.   Gardenia Phlegm, [place] [phone]   Note: PRIMARY CARE PROVIDER Vidal Schwalbe, [place] [phone]

## 2016-07-19 NOTE — Telephone Encounter (Signed)
Gave patient AVS and calender per 07/19/2016 los. Called GI breast center  And talked to Harvey , [place] order is on the work que and they will follow up and call the patient to schedule when the time gets closer.

## 2016-07-20 ENCOUNTER — Other Ambulatory Visit: Payer: Self-pay | Admitting: Adult Health

## 2016-07-20 DIAGNOSIS — Z1231 Encounter for screening mammogram for malignant neoplasm of breast: Secondary | ICD-10-CM

## 2016-07-23 ENCOUNTER — Encounter: Payer: Managed Care, Other (non HMO) | Admitting: Adult Health

## 2017-02-11 ENCOUNTER — Ambulatory Visit
Admission: RE | Admit: 2017-02-11 | Discharge: 2017-02-11 | Disposition: A | Discharge status: Home / Self Care | Payer: Managed Care, Other (non HMO) | Source: Ambulatory Visit | Attending: Adult Health | Admitting: Adult Health

## 2017-02-11 DIAGNOSIS — Z1231 Encounter for screening mammogram for malignant neoplasm of breast: Secondary | ICD-10-CM

## 2017-02-11 HISTORY — DX: Personal history of irradiation: Z92.3

## 2017-02-11 HISTORY — DX: Personal history of antineoplastic chemotherapy: Z92.21

## 2017-02-22 ENCOUNTER — Other Ambulatory Visit: Payer: Self-pay

## 2017-02-22 DIAGNOSIS — C50912 Malignant neoplasm of unspecified site of left female breast: Secondary | ICD-10-CM

## 2017-02-22 DIAGNOSIS — Z170 Estrogen receptor positive status [ER+]: Principal | ICD-10-CM

## 2017-02-24 NOTE — Progress Notes (Signed)
ID: Linda Kramer   DOB: 1960/11/28  MR#: [id number]  TGY#:[id number]  PCP: Linda Stains, MD GYN: Linda Kramer] OTHER MD:   CHIEF COMPLAINT: Estrogen receptor positive breast cancer  CURRENT TREATMENT: Tamoxifen  HISTORY OF PRESENT ILLNESS: From her initial intake note:  The patient had her routine screening mammogram on 10/22/2007 which showed a possible mass in the left breast. A left diagnostic mammogram and ultrasound on 11/10/2007 with spot compression views showed a 2.5 cm mass in the upper left breast posteriorly, also palpable. All ultrasound confirmed a 2.6 the regular hypoechoic lesion with an adjacent 1 cm angulated satellite lesion. Biopsy on 11/10/2007 [phone] and [id number]) showed an invasive ductal carcinoma, ER positive at 87%, PR positive at 12%, HER-2/neu negative, with a very low proliferation fraction of 3%.  Bilateral breast MRIs were obtained 11/18/2007, again noting a 2.3 cm oval mass in the left breast with spiculated margins and a satellite nodule immediately anterior to this. Together, both masses measured 3.4 cm. No abnormality noted in the right breast.  Patient underwent left lumpectomy and sentinel lymph node sampling on 12/08/2007 under the care of Dr. Margot Kramer, with final pathology [phone]) confirming a 3.8 cm invasive ductal carcinoma, grade 2 with 2 of 4 sentinel lymph nodes involved. 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  Patient underwent additional surgery for clear margin in September 2009. Consisted of a left partial mastectomy with removal of the areolar complex. Axillary node dissection was also performed, an additional 6 lymph nodes evaluated, one of which was involved with metastatic ductal carcinoma. At the time of surgery, patient also underwent total abdominal hysterectomy and bilateral salpingo-oophorectomy with benign pathology. [phone])  The patient underwent radiation therapy, after which she  began on tamoxifen in March of 2010. Tamoxifen was continued until August of 2012 at which time the patient switched to letrozole. Letrozole was then discontinued in February 2013 due to arthralgias and myalgias. After a three-month "wash out" period,the  patient started back on tamoxifen in June of 2013.  Her subsequent history is as detailed below  INTERVAL HISTORY: Linda Kramer returns today for follow-up and treatment of her estrogen receptor positive breast cancer. She reports that she is well overall. She continues on tamoxifen, she has hot flashes that were worsened this summer. She denies increase in vaginal wetness. She pays approximately $30 for a 3 month prescription of tamoxifen.   Since her last visit to the office she has had a routine screening bilateral mammogram with CAD at [facility] on 02/11/2017 with results showing no mammographic evidence of malignancy.   REVIEW OF SYSTEMS: [name] reports recent weight gain. She reports increase in blurred vision. She last saw her ophthalmologist 6 months ago and was informed that she needed glasses and evaluated for cataracts. When she eats, she feels fatigued. She denies unusual headaches, visual changes, nausea, vomiting, or dizziness. There has been no unusual cough, phlegm production, or pleurisy. This been no change in bowel or bladder habits. She denies unexplained fatigue or unexplained weight loss, bleeding, rash, or fever. A detailed review of systems was otherwise stable.    PAST MEDICAL HISTORY: Past Medical History:  Diagnosis Date  . Breast cancer, left breast ([place]) 2009  . Diabetes mellitus without complication ([place])   . History of blood transfusion   . Personal history of chemotherapy   . Personal history of radiation therapy   Significant for fibroids, varicose veins, anemia, endometriosis and wisdom teeth removal.  PAST SURGICAL HISTORY: Past Surgical History:  Procedure Laterality Date  . BREAST LUMPECTOMY   01/20/2008   left  . BREAST REDUCTION SURGERY     right    FAMILY HISTORY Family History  Problem Relation Age of Onset  . Hypertension Mother   . Arthritis Mother    The patient's father died from a heart attack at age 57. The patient's mother is alive and in her 34s. The patient is one of 8 siblings, 5 girls and 3 boys. One sister died in an automobile accident remotely. There is no known breast or ovarian cancer in the family  GYNECOLOGIC HISTORY:  Linda Kramer P0, status post total abdominal hysterectomy and bilateral salpingo-oophorectomy in August 2009.   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 Her husband, Linda Kramer, is an Event organiser. They have 2 adopted daughters, Linda Kramer and Linda Kramer, currently 16 and 49. Her oldest daughter currently attends [employer] and will transition to [employer] in the near future. Her youngest daughter is still currently in high school. The patient is a Psychologist, forensic.    ADVANCED DIRECTIVES: Not in place  HEALTH MAINTENANCE: Social History  Substance Use Topics  . Smoking status: Never Smoker  . Smokeless tobacco: Never Used  . Alcohol use 0.6 oz/week    1 Glasses of wine per week     Colonoscopy: 2012  PAP: UTD  Bone density: DEXA scan May 2012 was normal  Lipid panel: Dr. Harlan Kramer  No Known Allergies  Current Outpatient Prescriptions  Medication Sig Dispense Refill  . cholecalciferol (VITAMIN D) 400 UNITS TABS Take 2,000 Units by mouth daily.      . citalopram (CELEXA) 20 MG tablet Take 1 tablet (20 mg total) by mouth daily. (Patient not taking: Reported on 07/19/2016) 90 tablet 3  . citalopram (CELEXA) 40 MG tablet Take 40 mg by mouth daily.  1  . Dapagliflozin-Metformin HCl ER 10-500 MG TB24 Take by mouth.    . fish oil-omega-3 fatty acids 1000 MG capsule Take 1 g by mouth daily.      Marland Kitchen gabapentin (NEURONTIN) 300 MG capsule Take 1 capsule (300 mg total) by mouth at bedtime as  needed (hot flashes and post surgical pain). 30 capsule 11  . GARCINIA CAMBOGIA-CHROMIUM PO Take 1,000 mg by mouth daily. Reported on 07/26/2015    . ibuprofen (ADVIL,MOTRIN) 200 MG tablet Take 200 mg by mouth every 6 (six) hours as needed.      . Multiple Vitamin (MULTIVITAMIN) tablet Take 1 tablet by mouth daily.      . pantoprazole (PROTONIX) 40 MG tablet Take 1 tablet (40 mg total) by mouth daily. 90 tablet 3  . tamoxifen (NOLVADEX) 20 MG tablet Take 1 tablet (20 mg total) by mouth daily. 90 tablet 12   No current facility-administered medications for this visit.     OBJECTIVE: Middle-aged African American womanin no acute distress  ECOG FS: 1  Vitals:   02/25/17 1501  BP: 133/75  Pulse: 86  Resp: 18  Temp: 98.4 F (36.9 C)  TempSrc: Oral  SpO2: 100%  Weight: 173 lb 12.8 oz (78.8 kg)  Height: _0  (1.626 m)  Body mass index is 29.83 kg/m.   Sclerae unicteric, EOMs intact Oropharynx clear and moist No cervical or supraclavicular adenopathy Lungs no rales or rhonchi Heart regular rate and rhythm Abd soft, nontender, positive bowel sounds MSK no focal spinal tenderness, no upper extremity lymphedema Neuro: nonfocal, well oriented,  appropriate affect Breasts: The right breast is unremarkable. Left breast is status post mastectomy and reconstruction with no evidence of local recurrence. Both axillae are benign.   LAB RESULTS: Lab Results  Component Value Date   WBC 6.7 02/25/2017   NEUTROABS 3.2 02/25/2017   HGB 11.8 02/25/2017   HCT 37.1 02/25/2017   MCV 88.5 02/25/2017   PLT 194 02/25/2017      Chemistry      Component Value Date/Time   NA 141 02/20/2016 1435   K 4.2 02/20/2016 1435   CL 103 07/09/2012 1500   CO2 28 02/20/2016 1435   BUN 11.2 02/20/2016 1435   CREATININE 0.8 02/20/2016 1435      Component Value Date/Time   CALCIUM 9.1 02/20/2016 1435   ALKPHOS 102 02/20/2016 1435   AST 16 02/20/2016 1435   ALT 12 02/20/2016 1435   BILITOT <0.22  02/20/2016 1435       Lab Results  Component Value Date   LABCA2 25 07/16/2013    STUDIES: Mm Digital Screening Bilateral  Result Date: 02/12/2017 CLINICAL DATA:  Screening. EXAM: DIGITAL SCREENING BILATERAL MAMMOGRAM WITH CAD COMPARISON:  Previous exam(s). ACR Breast Density Category b: There are scattered areas of fibroglandular density. FINDINGS: There are no findings suspicious for malignancy. Images were processed with CAD. IMPRESSION: No mammographic evidence of malignancy. A result letter of this screening mammogram will be mailed directly to the patient. RECOMMENDATION: Screening mammogram in one year. (Code:SM-B-01Y) BI-RADS CATEGORY  1: Negative. Electronically Signed   By: Lajean Manes M.D.   On: 02/12/2017 14:06     ASSESSMENT: 56 y.o. BRCA 1-2 negative [place] woman   1. Status post left lumpectomy and sentinel lymph node biopsy in August 2009 for a T2 N1, stage IIB, invasive ductal carcinoma, grade 2, estrogen receptor 87% positive, progesterone receptor 12% positive, with an MIB-1 of 3% and no HER-2 amplification   2. 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   3. Received radiation therapy, completed March 2010   4. Started on tamoxifen in March of 2010 and continued until August of 2012.   5. Began on letrozole in August 2012, discontinued February 2013 because of arthralgias/myalgias.  Resumed tamoxifen in June 2013.  6. Status post TAH/BSO in August 2009 with benign pathology   PLAN: Nathalya is now 9 years out from definitive surgery for her breast cancer with no evidence of disease recurrence. This is very favorable.  She continues on tamoxifen and, generally with good tolerance. The plan is to continue that one more year at which point she will probably come off that medication  She remains somewhat anxious about the possibility of recurrence.  If she wishes to continue to be seen here on a once a year basis we will probably set her up with our survivorship nurse practitioner and alternate visits with me through her.  Today I gave her a new prescription for bras. She does need to have a rise of updated and get at  [employer]  Otherwise she will call with any problems that may develop bre the next visit  Tery Hoeger, Virgie Dad, MD  02/25/17 3:18 PM Medical Oncology and Hematology [facility] [place], [place] [address] Tel. [phone]    Fax. [phone]  This document serves as a record of services personally performed by Lurline Del, MD. It was created on her behalf by [facility], a  trained medical scribe. The creation of this record is based on the scribe's personal observations and the provider's statements to them. This document has been checked and approved by the attending provider.

## 2017-02-25 ENCOUNTER — Other Ambulatory Visit (HOSPITAL_BASED_OUTPATIENT_CLINIC_OR_DEPARTMENT_OTHER): Payer: Managed Care, Other (non HMO)

## 2017-02-25 ENCOUNTER — Ambulatory Visit (HOSPITAL_BASED_OUTPATIENT_CLINIC_OR_DEPARTMENT_OTHER): Payer: Managed Care, Other (non HMO) | Admitting: Oncology

## 2017-02-25 ENCOUNTER — Telehealth: Payer: Self-pay | Admitting: Oncology

## 2017-02-25 DIAGNOSIS — Z170 Estrogen receptor positive status [ER+]: Secondary | ICD-10-CM

## 2017-02-25 DIAGNOSIS — Z79810 Long term (current) use of selective estrogen receptor modulators (SERMs): Secondary | ICD-10-CM | POA: Diagnosis not present

## 2017-02-25 DIAGNOSIS — H538 Other visual disturbances: Secondary | ICD-10-CM | POA: Diagnosis not present

## 2017-02-25 DIAGNOSIS — C50812 Malignant neoplasm of overlapping sites of left female breast: Secondary | ICD-10-CM | POA: Insufficient documentation

## 2017-02-25 DIAGNOSIS — C50912 Malignant neoplasm of unspecified site of left female breast: Secondary | ICD-10-CM

## 2017-02-25 LAB — COMPREHENSIVE METABOLIC PANEL
ALBUMIN: 3.6 g/dL (ref 3.5–5.0)
ALK PHOS: 96 U/L (ref 40–150)
ALT: 16 U/L (ref 0–55)
AST: 19 U/L (ref 5–34)
Anion Gap: 8 mEq/L (ref 3–11)
BUN: 11 mg/dL (ref 7.0–26.0)
CALCIUM: 8.9 mg/dL (ref 8.4–10.4)
CO2: 27 mEq/L (ref 22–29)
CREATININE: 0.8 mg/dL (ref 0.6–1.1)
Chloride: 103 mEq/L (ref 98–109)
EGFR: >60 mL/min/{1.73_m2} (ref >60)
GLUCOSE: 130 mg/dL (ref 70–140)
Potassium: 4.2 mEq/L (ref 3.5–5.1)
SODIUM: 139 meq/L (ref 136–145)
Total Bilirubin: <0.22 mg/dL (ref 0.20–1.20)
Total Protein: 7.2 g/dL (ref 6.4–8.3)

## 2017-02-25 LAB — CBC WITH DIFFERENTIAL/PLATELET
BASO%: 0.1 % (ref 0.0–2.0)
Basophils Absolute: 0 10*3/uL (ref 0.0–0.1)
EOS%: 1.9 % (ref 0.0–7.0)
Eosinophils Absolute: 0.1 10*3/uL (ref 0.0–0.5)
HEMATOCRIT: 37.1 % (ref 34.8–46.6)
HGB: 11.8 g/dL (ref 11.6–15.9)
LYMPH%: 43.2 % (ref 14.0–49.7)
MCH: 28.2 pg (ref 25.1–34.0)
MCHC: 31.8 g/dL (ref 31.5–36.0)
MCV: 88.5 fL (ref 79.5–101.0)
MONO#: 0.5 10*3/uL (ref 0.1–0.9)
MONO%: 7.1 % (ref 0.0–14.0)
NEUT#: 3.2 10*3/uL (ref 1.5–6.5)
NEUT%: 47.7 % (ref 38.4–76.8)
Platelets: 194 10*3/uL (ref 145–400)
RBC: 4.19 10*6/uL (ref 3.70–5.45)
RDW: 13.4 % (ref 11.2–14.5)
WBC: 6.7 10*3/uL (ref 3.9–10.3)
lymph#: 2.9 10*3/uL (ref 0.9–3.3)

## 2017-02-25 MED ORDER — TAMOXIFEN CITRATE 20 MG PO TABS: 20.0000 mg | ORAL_TABLET | Freq: Every day | ORAL | 12 refills | Status: DC

## 2017-02-25 NOTE — Telephone Encounter (Signed)
Gave patient avs report and appointments for March and October 2019.

## 2017-03-06 ENCOUNTER — Other Ambulatory Visit: Payer: Self-pay

## 2017-03-13 ENCOUNTER — Other Ambulatory Visit: Payer: Self-pay | Admitting: *Deleted

## 2017-03-13 MED ORDER — TAMOXIFEN CITRATE 20 MG PO TABS: 20.0000 mg | ORAL_TABLET | Freq: Every day | ORAL | 12 refills | Status: DC

## 2017-07-25 ENCOUNTER — Encounter: Payer: Self-pay | Admitting: Adult Health

## 2017-07-25 ENCOUNTER — Inpatient Hospital Stay: Payer: Managed Care, Other (non HMO) | Attending: Adult Health | Admitting: Adult Health

## 2017-07-25 VITALS — BP 139/80 | HR 82 | Temp 98.2°F | Resp 18 | Ht 64.0 in | Wt 172.7 lb

## 2017-07-25 DIAGNOSIS — Z170 Estrogen receptor positive status [ER+]: Secondary | ICD-10-CM | POA: Diagnosis not present

## 2017-07-25 DIAGNOSIS — Z9221 Personal history of antineoplastic chemotherapy: Secondary | ICD-10-CM | POA: Insufficient documentation

## 2017-07-25 DIAGNOSIS — E119 Type 2 diabetes mellitus without complications: Secondary | ICD-10-CM | POA: Insufficient documentation

## 2017-07-25 DIAGNOSIS — C50912 Malignant neoplasm of unspecified site of left female breast: Secondary | ICD-10-CM

## 2017-07-25 DIAGNOSIS — Z923 Personal history of irradiation: Secondary | ICD-10-CM | POA: Insufficient documentation

## 2017-07-25 DIAGNOSIS — Z79810 Long term (current) use of selective estrogen receptor modulators (SERMs): Secondary | ICD-10-CM | POA: Insufficient documentation

## 2017-07-25 DIAGNOSIS — C50812 Malignant neoplasm of overlapping sites of left female breast: Secondary | ICD-10-CM | POA: Diagnosis not present

## 2017-07-25 NOTE — Progress Notes (Signed)
CLINIC:  Survivorship   REASON FOR VISIT:  Routine follow-up for history of breast cancer.   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  Overall, she reports feeling quite well. She continues to take Tamoxifen daily and is tolerating it well.  She is exercising regularly.  She denies any new issues   REVIEW OF SYSTEMS:  Review of Systems  Constitutional: Negative for appetite change, chills, diaphoresis, fatigue and fever.  HENT:   Negative for hearing loss, lump/mass, mouth sores, sore throat and trouble swallowing.   Eyes: Negative for eye problems and icterus.  Respiratory: Negative for chest tightness, cough and shortness of breath.   Cardiovascular: Negative for chest pain, leg swelling and palpitations.  Gastrointestinal: Negative for abdominal distention, abdominal pain, constipation, diarrhea, nausea and vomiting.  Endocrine: Negative for hot flashes.  Musculoskeletal: Negative for arthralgias and back pain.  Skin: Negative for itching and rash.  Neurological: Negative for dizziness, headaches and numbness.  Psychiatric/Behavioral: Negative for depression. The patient is not nervous/anxious.    Breast: Denies any new nodularity, masses,  tenderness, nipple changes, or nipple discharge.       PAST MEDICAL/SURGICAL HISTORY:  Past Medical History:  Diagnosis Date  . Breast cancer, left breast ([place]) 2009  . Diabetes mellitus without complication ([place])   . History of blood transfusion   . Personal history of chemotherapy   . Personal history of radiation therapy    Past Surgical History:  Procedure Laterality Date  . BREAST LUMPECTOMY  01/20/2008   left  . BREAST REDUCTION SURGERY     right     ALLERGIES:  No Known Allergies   CURRENT MEDICATIONS:  Outpatient Encounter Medications as of 07/25/2017  Medication Sig  . cholecalciferol (VITAMIN D) 400 UNITS TABS Take 2,000 Units by mouth daily.    . citalopram (CELEXA) 40 MG tablet Take 40 mg by mouth daily.  . Dapagliflozin-metFORMIN HCl ER 02-999 MG TB24 Take by mouth daily.   . fish oil-omega-3 fatty acids 1000 MG capsule Take 1 g by mouth daily.    Marland Kitchen GARCINIA CAMBOGIA-CHROMIUM PO Take 1,000 mg by mouth daily. Reported on 07/26/2015  . ibuprofen (ADVIL,MOTRIN) 200 MG tablet Take 200 mg by mouth every 6 (six) hours as needed.    Marland Kitchen JANUVIA 100 MG tablet   . Multiple Vitamin (MULTIVITAMIN) tablet Take 1 tablet by mouth daily.    . pantoprazole (PROTONIX) 40 MG tablet Take 1 tablet (40 mg total) by mouth daily.  . tamoxifen (NOLVADEX) 20 MG tablet Take 1 tablet (20 mg total) daily by mouth.   No facility-administered encounter medications on file as of  07/25/2017.      ONCOLOGIC FAMILY HISTORY:  Family History  Problem Relation Age of Onset  . Hypertension Mother   . Arthritis Mother       SOCIAL HISTORY:  Social History   Socioeconomic History  . Marital status: Married    Spouse name: Not on file  . Number of children: Not on file  . Years of education: Not on file  . Highest education level: Not on file  Occupational History  . Not on file  Social Needs  . Financial resource strain: Not on file  . Food insecurity:    Worry: Not on file     Inability: Not on file  . Transportation needs:    Medical: Not on file    Non-medical: Not on file  Tobacco Use  . Smoking status: Never Smoker  . Smokeless tobacco: Never Used  Substance and Sexual Activity  . Alcohol use: Yes    Alcohol/week: 0.6 oz    Types: 1 Glasses of wine per week  . Drug use: No  . Sexual activity: Not on file  Lifestyle  . Physical activity:    Days per week: Not on file    Minutes per session: Not on file  . Stress: Not on file  Relationships  . Social connections:    Talks on phone: Not on file    Gets together: Not on file    Attends religious service: Not on file    Active member of club or organization: Not on file    Attends meetings of clubs or organizations: Not on file    Relationship status: Not on file  . Intimate partner violence:    Fear of current or ex partner: Not on file    Emotionally abused: Not on file    Physically abused: Not on file    Forced sexual activity: Not on file  Other Topics Concern  . Not on file  Social History Narrative  . Not on file      PHYSICAL EXAMINATION:  Vital Signs: Vitals:   07/25/17 1517  BP: 139/80  Pulse: 82  Resp: 18  Temp: 98.2 F (36.8 C)  SpO2: 100%   Filed Weights   07/25/17 1517  Weight: 172 lb 11.2 oz (78.3 kg)   General: Well-nourished, well-appearing female in no acute distress.  Unaccompanied today.   HEENT: Head is normocephalic.  Pupils equal and reactive to light. Conjunctivae clear without exudate.  Sclerae anicteric. Oral mucosa is pink, moist.  Oropharynx is pink without lesions or erythema.  Lymph: No cervical, supraclavicular, or infraclavicular lymphadenopathy noted on palpation.  Cardiovascular: Regular rate and rhythm.Marland Kitchen Respiratory: Clear to auscultation bilaterally. Chest expansion symmetric; breathing non-labored.  Breast Exam:  -Left breast: No appreciable masses on palpation. No skin redness, thickening, or peau d'orange appearance; ; mild distortion in  symmetry at previous lumpectomy site well healed scar without erythema or nodularity.  -Right breast: No appreciable masses on palpation. No skin redness, thickening, or peau d'orange appearance; no nipple retraction or nipple discharge;  -Axilla: No axillary adenopathy bilaterally.  GI: Abdomen soft and round; non-tender, non-distended. Bowel sounds normoactive. No hepatosplenomegaly.   GU: Deferred.  Neuro: No focal deficits. Steady gait.  Psych: Mood and affect normal and appropriate for situation.  MSK: No focal spinal tenderness to palpation, full range of motion in bilateral upper extremities Extremities: No edema. Skin: Warm and dry.  LABORATORY DATA:  None for this visit   DIAGNOSTIC IMAGING:  Most recent  mammogram:      ASSESSMENT AND PLAN:  Ms.. Kramer is a pleasant 57 y.o. female with history of Stage IIB left breast invasive ductal carcinoma, ER+/PR+/HER2-, diagnosed in 11/2007, treated with lumpectomy, adjuvant radiation therapy, and anti-estrogen therapy currently with Tamoxifen daily with goal to take x 10 years.  She [facility] for surveillance and routine follow-up.   1. History of breast cancer:  Linda Kramer is currently clinically and radiographically without evidence of disease or recurrence of breast cancer. She will be due for mammogram in 02/2018.  She will continue her anti-estrogen therapy with Tamoxifen, with plans to continue for 10 years.  She will return to the cancer center to see her medical oncologist, Dr. Jana Hakim, in 10//2019.  I encouraged her to call me with any questions or concerns before her next visit at the cancer center, and I would be happy to see her sooner, if needed.    2. Cancer screening:  Due to Linda Kramer's history and her age, she should receive screening for skin cancers, colon cancer. She was encouraged to follow-up with her PCP for appropriate cancer screenings.   3. Health maintenance and wellness promotion: Ms.  Kramer was encouraged to consume 5-7 servings of fruits and vegetables per day. She was also encouraged to engage in moderate to vigorous exercise for 30 minutes per day most days of the week. She was instructed to limit her alcohol consumption and continue to abstain from tobacco use.    Dispo:  -Return to cancer center in 6 months for f/u with Dr. Jana Hakim -Mammogram in 02/2018   A total of (30) minutes of face-to-face time was spent with this patient with greater than 50% of that time in counseling and care-coordination.   Gardenia Phlegm, NP Survivorship Program [facility] [phone]   Note: PRIMARY CARE PROVIDER [name], [place] [phone]

## 2017-07-26 ENCOUNTER — Telehealth: Payer: Self-pay | Admitting: Adult Health

## 2017-07-26 NOTE — Telephone Encounter (Signed)
Per 3/21 no los °

## 2017-12-19 ENCOUNTER — Encounter: Payer: Self-pay | Admitting: Genetics

## 2018-02-24 ENCOUNTER — Other Ambulatory Visit: Payer: Self-pay | Admitting: *Deleted

## 2018-02-24 DIAGNOSIS — C50912 Malignant neoplasm of unspecified site of left female breast: Secondary | ICD-10-CM

## 2018-02-24 DIAGNOSIS — Z170 Estrogen receptor positive status [ER+]: Principal | ICD-10-CM

## 2018-02-25 ENCOUNTER — Telehealth: Payer: Self-pay | Admitting: Oncology

## 2018-02-25 ENCOUNTER — Encounter: Payer: Self-pay | Admitting: Oncology

## 2018-02-25 ENCOUNTER — Inpatient Hospital Stay: Payer: Managed Care, Other (non HMO)

## 2018-02-25 ENCOUNTER — Inpatient Hospital Stay: Payer: Managed Care, Other (non HMO) | Attending: Oncology | Admitting: Oncology

## 2018-02-25 VITALS — BP 142/85 | HR 75 | Temp 98.4°F | Resp 18 | Ht 64.0 in | Wt 165.7 lb

## 2018-02-25 DIAGNOSIS — Z9221 Personal history of antineoplastic chemotherapy: Secondary | ICD-10-CM | POA: Insufficient documentation

## 2018-02-25 DIAGNOSIS — Z170 Estrogen receptor positive status [ER+]: Secondary | ICD-10-CM | POA: Insufficient documentation

## 2018-02-25 DIAGNOSIS — Z90710 Acquired absence of both cervix and uterus: Secondary | ICD-10-CM | POA: Diagnosis not present

## 2018-02-25 DIAGNOSIS — I890 Lymphedema, not elsewhere classified: Secondary | ICD-10-CM

## 2018-02-25 DIAGNOSIS — Z923 Personal history of irradiation: Secondary | ICD-10-CM | POA: Insufficient documentation

## 2018-02-25 DIAGNOSIS — Z79810 Long term (current) use of selective estrogen receptor modulators (SERMs): Secondary | ICD-10-CM | POA: Diagnosis not present

## 2018-02-25 DIAGNOSIS — C50812 Malignant neoplasm of overlapping sites of left female breast: Secondary | ICD-10-CM | POA: Insufficient documentation

## 2018-02-25 DIAGNOSIS — C50912 Malignant neoplasm of unspecified site of left female breast: Secondary | ICD-10-CM

## 2018-02-25 LAB — CMP (CANCER CENTER ONLY)
ALBUMIN: 3.7 g/dL (ref 3.5–5.0)
ALK PHOS: 108 U/L (ref 38–126)
ALT: 21 U/L (ref 0–44)
AST: 20 U/L (ref 15–41)
Anion gap: 11 (ref 5–15)
BUN: 14 mg/dL (ref 6–20)
CALCIUM: 9 mg/dL (ref 8.9–10.3)
CO2: 27 mmol/L (ref 22–32)
CREATININE: 0.78 mg/dL (ref 0.44–1.00)
Chloride: 105 mmol/L (ref 98–111)
GFR, Est AFR Am: >60 mL/min (ref >60)
GFR, Estimated: >60 mL/min (ref >60)
Glucose, Bld: 86 mg/dL (ref 70–99)
Potassium: 3.5 mmol/L (ref 3.5–5.1)
Sodium: 143 mmol/L (ref 135–145)
Total Bilirubin: <0.2 mg/dL — ABNORMAL LOW (ref 0.3–1.2)
Total Protein: 7.4 g/dL (ref 6.5–8.1)

## 2018-02-25 LAB — CBC WITH DIFFERENTIAL (CANCER CENTER ONLY)
Abs Immature Granulocytes: 0.01 10*3/uL (ref 0.00–0.07)
BASOS PCT: 0 %
Basophils Absolute: 0 10*3/uL (ref 0.0–0.1)
EOS PCT: 2 %
Eosinophils Absolute: 0.1 10*3/uL (ref 0.0–0.5)
HEMATOCRIT: 37.1 % (ref 36.0–46.0)
Hemoglobin: 11.5 g/dL — ABNORMAL LOW (ref 12.0–15.0)
Immature Granulocytes: 0 %
LYMPHS ABS: 3.2 10*3/uL (ref 0.7–4.0)
Lymphocytes Relative: 40 %
MCH: 27.7 pg (ref 26.0–34.0)
MCHC: 31 g/dL (ref 30.0–36.0)
MCV: 89.4 fL (ref 80.0–100.0)
MONO ABS: 0.4 10*3/uL (ref 0.1–1.0)
Monocytes Relative: 6 %
NEUTROS PCT: 52 %
Neutro Abs: 4.2 10*3/uL (ref 1.7–7.7)
Platelet Count: 202 10*3/uL (ref 150–400)
RBC: 4.15 MIL/uL (ref 3.87–5.11)
RDW: 13.1 % (ref 11.5–15.5)
WBC Count: 8 10*3/uL (ref 4.0–10.5)
nRBC: 0 % (ref 0.0–0.2)

## 2018-02-25 NOTE — Telephone Encounter (Signed)
Gave avs and calendar ° °

## 2018-02-25 NOTE — Progress Notes (Signed)
ID: Linda Kramer   DOB: 07-Dec-1960  MR#: [id number]  CSN#:[id number]  PCP: [name], MD GYN: SUGerald Stabs Streck] OTHER MD:   CHIEF COMPLAINT: Estrogen receptor positive breast cancer  CURRENT TREATMENT: Completing 10 years of tamoxifen  HISTORY OF PRESENT ILLNESS: From her initial intake note:  The patient had her routine screening mammogram on 10/22/2007 which showed a possible mass in the left breast. A left diagnostic mammogram and ultrasound on 11/10/2007 with spot compression views showed a 2.5 cm mass in the upper left breast posteriorly, also palpable. All ultrasound confirmed a 2.6 the regular hypoechoic lesion with an adjacent 1 cm angulated satellite lesion. Biopsy on 11/10/2007 [id number] and [id number]) showed an invasive ductal carcinoma, ER positive at 87%, PR positive at 12%, HER-2/neu negative, with a very low proliferation fraction of 3%.  Bilateral breast MRIs were obtained 11/18/2007, again noting a 2.3 cm oval mass in the left breast with spiculated margins and a satellite nodule immediately anterior to this. Together, both masses measured 3.4 cm. No abnormality noted in the right breast.  Patient underwent left lumpectomy and sentinel lymph node sampling on 12/08/2007 under the care of Dr. Margot Chimes, with final pathology [id number]) confirming a 3.8 cm invasive ductal carcinoma, grade 2 with 2 of 4 sentinel lymph nodes involved. 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  Patient underwent additional surgery for clear margin in September 2009. Consisted of a left partial mastectomy with removal of the areolar complex. Axillary node dissection was also performed, an additional 6 lymph nodes evaluated, one of which was involved with metastatic ductal carcinoma. At the time of surgery, patient also underwent total abdominal hysterectomy and bilateral salpingo-oophorectomy with benign pathology. [phone]) The patient underwent radiation  therapy, after which she began on tamoxifen in March of 2010. Tamoxifen was continued until August of 2012 at which time the patient switched to letrozole. Letrozole was then discontinued in February 2013 due to arthralgias and myalgias. After a three-month "wash out" period,the  patient started back on tamoxifen in June of 2013.  Her subsequent history is as detailed below  INTERVAL HISTORY: Linda Kramer returns today for follow-up and treatment of her estrogen receptor positive breast cancer accompanied by a family member. She reports that she is well overall. She continues on tamoxifen, with good tolerance. She reports hot flashes are mild and do not affect her daily life. She denies vaginal discharge.    REVIEW OF SYSTEMS: Pheobe now works another part-time job at [employer] in [employer]. She states her mother passed away 2017/08/23 from heart issues. She denies unusual headaches, visual changes, nausea, vomiting, or dizziness. There has been no unusual cough, phlegm production, or pleurisy. This been no change in bowel or bladder habits. She denies unexplained fatigue or unexplained weight loss, bleeding, rash, or fever. A detailed review of systems was otherwise stable.    PAST MEDICAL HISTORY: Past Medical History:  Diagnosis Date  . Anemia   . Breast cancer, left breast ([place]) 2009  . Diabetes mellitus without complication ([place])   . Endometriosis   . History of blood transfusion   . Personal history of chemotherapy   . Personal history of radiation therapy   Significant for fibroids, varicose veins.   PAST SURGICAL HISTORY: Past Surgical History:  Procedure Laterality Date  . ABDOMINAL HYSTERECTOMY Bilateral 12/2007   w/ BSO  . BREAST LUMPECTOMY  01/20/2008   left  . BREAST REDUCTION SURGERY     right  .  WISDOM TOOTH EXTRACTION Bilateral     FAMILY HISTORY Family History  Problem Relation Age of Onset  . Hypertension Mother   . Arthritis Mother   . Heart attack Mother         cause of death  . Heart attack Father 63       cause of death  . Breast cancer Neg Hx   . Ovarian cancer Neg Hx    The patient's father died from a heart attack at age 65. The patient's mother passed away 08-31-2017 from heart issues and was in her 78s. The patient is one of 8 siblings, 5 girls and 3 boys. One sister died in an automobile accident remotely. There is no known breast or ovarian cancer in the family  GYNECOLOGIC HISTORY:  Linda Kramer P0, status post total abdominal hysterectomy and bilateral salpingo-oophorectomy in August 2009.   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  Her husband, Linda Kramer, is an Event organiser. They have 2 adopted daughters, Linda Kramer and Linda Kramer.The patient is a Psychologist, forensic.    ADVANCED DIRECTIVES: Not in place  HEALTH MAINTENANCE: Social History   Tobacco Use  . Smoking status: Never Smoker  . Smokeless tobacco: Never Used  Substance Use Topics  . Alcohol use: Yes    Alcohol/week: 1.0 standard drinks    Types: 1 Glasses of wine per week  . Drug use: No     Colonoscopy: 2012  PAP: UTD  Bone density: DEXA scan May 2012 was normal  Lipid panel: Dr. [name]  No Known Allergies  Current Outpatient Medications  Medication Sig Dispense Refill  . cholecalciferol (VITAMIN D) 400 UNITS TABS Take 2,000 Units by mouth daily.      . citalopram (CELEXA) 40 MG tablet Take 40 mg by mouth daily.  1  . Dapagliflozin-metFORMIN HCl ER 02-999 MG TB24 Take by mouth daily.     . fish oil-omega-3 fatty acids 1000 MG capsule Take 1 g by mouth daily.      Marland Kitchen GARCINIA CAMBOGIA-CHROMIUM PO Take 1,000 mg by mouth daily. Reported on 07/26/2015    . ibuprofen (ADVIL,MOTRIN) 200 MG tablet Take 200 mg by mouth every 6 (six) hours as needed.      Marland Kitchen JANUVIA 100 MG tablet     . Multiple Vitamin (MULTIVITAMIN) tablet Take 1 tablet by mouth daily.      .  pantoprazole (PROTONIX) 40 MG tablet Take 1 tablet (40 mg total) by mouth daily. 90 tablet 3  . tamoxifen (NOLVADEX) 20 MG tablet Take 1 tablet (20 mg total) daily by mouth. 90 tablet 12   No current facility-administered medications for this visit.     OBJECTIVE: Middle-aged [place] American woman who appears well  ECOG FS: 0  Vitals:   02/25/18 1500  BP: (!) 142/85  Pulse: 75  Resp: 18  Temp: 98.4 F (36.9 C)  TempSrc: Oral  SpO2: 100%  Weight: 165 lb 11.2 oz (75.2 kg)  Height: '5\' 4"'$  (1.626 m)  Body mass index is 28.44 kg/m.   Sclerae unicteric, pupils round and equal No cervical or supraclavicular adenopathy Lungs no rales or rhonchi Heart regular rate and rhythm Abd soft, nontender, positive bowel sounds MSK no focal spinal tenderness, no upper extremity lymphedema Neuro: nonfocal, well oriented, appropriate affect Breasts: The right breast is benign.  The left breast is status post central lumpectomy.  It  also has undergone reconstruction.  There is no evidence of local recurrence.  Both axillae are benign.  LAB RESULTS: Lab Results  Component Value Date   WBC 8.0 02/25/2018   NEUTROABS 4.2 02/25/2018   HGB 11.5 (L) 02/25/2018   HCT 37.1 02/25/2018   MCV 89.4 02/25/2018   PLT 202 02/25/2018      Chemistry      Component Value Date/Time   NA 143 02/25/2018 1456   NA 139 02/25/2017 1450   K 3.5 02/25/2018 1456   K 4.2 02/25/2017 1450   CL 105 02/25/2018 1456   CL 103 07/09/2012 1500   CO2 27 02/25/2018 1456   CO2 27 02/25/2017 1450   BUN 14 02/25/2018 1456   BUN 11.0 02/25/2017 1450   CREATININE 0.78 02/25/2018 1456   CREATININE 0.8 02/25/2017 1450      Component Value Date/Time   CALCIUM 9.0 02/25/2018 1456   CALCIUM 8.9 02/25/2017 1450   ALKPHOS 108 02/25/2018 1456   ALKPHOS 96 02/25/2017 1450   AST 20 02/25/2018 1456   AST 19 02/25/2017 1450   ALT 21 02/25/2018 1456   ALT 16 02/25/2017 1450   BILITOT <0.2 (L) 02/25/2018 1456   BILITOT <0.22  02/25/2017 1450       Lab Results  Component Value Date   LABCA2 25 07/16/2013    STUDIES: No results found.   ASSESSMENT: 57 y.o. BRCA 1-2 negative [place] woman   1. Status post left lumpectomy and sentinel lymph node biopsy in August 2009 for a T2 N1, stage IIB, invasive ductal carcinoma, grade 2, estrogen receptor 87% positive, progesterone receptor 12% positive, with an MIB-1 of 3% and no HER-2 amplification   2. 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   3. Received radiation therapy, completed March 2010   4. Started on tamoxifen in March of 2010 and continued until August of 2012.   5. Began on letrozole in August 2012, discontinued February 2013 because of arthralgias/myalgias.  Resumed tamoxifen in June 2013.  Discontinued December 2019  6. Status post TAH/BSO in August 2009 with benign pathology   PLAN: Jenevieve is now 10 years out from definitive surgery for her breast cancer with no evidence of disease recurrence.  This is very favorable.  She is just about at 10 years on tamoxifen so I am comfortable with her simply completing the tamoxifen she has left, which will take it through the end of this year, and then stopping.  I reassured her that she does not need to taper it off.  We discussed the option of simply being discharged from follow-up here, survivorship, or continuing to see me once a year.  What she would like to do after much thought is to see our survivorship nurse practitioner next year and then see me again 2 years from now at which point she thinks she might be ready to formally graduate.  She is behind on her mammogram and I have gone ahead and placed that order for her  She knows to call for any other issues that may develop before the next visit.  Krissy Orebaugh, Virgie Dad, MD  02/25/18 3:36 PM Medical Oncology and Hematology [facility] [address] [place], [place] [address] Tel. [phone]    Fax. [phone]    I, Wilburn Mylar am acting as scribe for Dr. Virgie Dad. Radek Carnero.  Lindie Spruce MD, have reviewed  the above documentation for accuracy and completeness, and I agree with the above.

## 2018-03-18 ENCOUNTER — Other Ambulatory Visit: Payer: Self-pay | Admitting: Oncology

## 2018-03-18 DIAGNOSIS — Z1231 Encounter for screening mammogram for malignant neoplasm of breast: Secondary | ICD-10-CM

## 2018-03-18 DIAGNOSIS — I890 Lymphedema, not elsewhere classified: Secondary | ICD-10-CM

## 2018-05-02 ENCOUNTER — Ambulatory Visit
Admission: RE | Admit: 2018-05-02 | Discharge: 2018-05-02 | Disposition: A | Discharge status: Home / Self Care | Payer: Managed Care, Other (non HMO) | Source: Ambulatory Visit | Attending: Oncology | Admitting: Oncology

## 2018-05-02 DIAGNOSIS — I890 Lymphedema, not elsewhere classified: Secondary | ICD-10-CM

## 2018-05-02 DIAGNOSIS — Z1231 Encounter for screening mammogram for malignant neoplasm of breast: Secondary | ICD-10-CM

## 2019-03-30 ENCOUNTER — Inpatient Hospital Stay (HOSPITAL_BASED_OUTPATIENT_CLINIC_OR_DEPARTMENT_OTHER): Payer: Managed Care, Other (non HMO) | Admitting: Adult Health

## 2019-03-30 ENCOUNTER — Inpatient Hospital Stay: Payer: Managed Care, Other (non HMO) | Attending: Oncology

## 2019-03-30 ENCOUNTER — Other Ambulatory Visit: Payer: Self-pay

## 2019-03-30 ENCOUNTER — Encounter: Payer: Self-pay | Admitting: Adult Health

## 2019-03-30 ENCOUNTER — Other Ambulatory Visit: Payer: Self-pay | Admitting: Oncology

## 2019-03-30 ENCOUNTER — Inpatient Hospital Stay: Payer: Managed Care, Other (non HMO)

## 2019-03-30 VITALS — BP 143/75 | HR 87 | Temp 97.9°F | Resp 18 | Ht 64.0 in | Wt 170.9 lb

## 2019-03-30 DIAGNOSIS — Z90710 Acquired absence of both cervix and uterus: Secondary | ICD-10-CM | POA: Diagnosis not present

## 2019-03-30 DIAGNOSIS — Z170 Estrogen receptor positive status [ER+]: Secondary | ICD-10-CM

## 2019-03-30 DIAGNOSIS — C50812 Malignant neoplasm of overlapping sites of left female breast: Secondary | ICD-10-CM

## 2019-03-30 DIAGNOSIS — D649 Anemia, unspecified: Secondary | ICD-10-CM | POA: Insufficient documentation

## 2019-03-30 DIAGNOSIS — I890 Lymphedema, not elsewhere classified: Secondary | ICD-10-CM

## 2019-03-30 DIAGNOSIS — Z1231 Encounter for screening mammogram for malignant neoplasm of breast: Secondary | ICD-10-CM

## 2019-03-30 DIAGNOSIS — Z853 Personal history of malignant neoplasm of breast: Secondary | ICD-10-CM | POA: Diagnosis present

## 2019-03-30 DIAGNOSIS — Z9221 Personal history of antineoplastic chemotherapy: Secondary | ICD-10-CM | POA: Diagnosis not present

## 2019-03-30 DIAGNOSIS — Z923 Personal history of irradiation: Secondary | ICD-10-CM | POA: Insufficient documentation

## 2019-03-30 DIAGNOSIS — E119 Type 2 diabetes mellitus without complications: Secondary | ICD-10-CM | POA: Diagnosis not present

## 2019-03-30 LAB — COMPREHENSIVE METABOLIC PANEL
ALT: 20 U/L (ref 0–44)
AST: 21 U/L (ref 15–41)
Albumin: 3.7 g/dL (ref 3.5–5.0)
Alkaline Phosphatase: 113 U/L (ref 38–126)
Anion gap: 11 (ref 5–15)
BUN: 13 mg/dL (ref 6–20)
CO2: 27 mmol/L (ref 22–32)
Calcium: 8.8 mg/dL — ABNORMAL LOW (ref 8.9–10.3)
Chloride: 104 mmol/L (ref 98–111)
Creatinine, Ser: 0.91 mg/dL (ref 0.44–1.00)
GFR calc Af Amer: >60 mL/min (ref >60)
GFR calc non Af Amer: >60 mL/min (ref >60)
Glucose, Bld: 99 mg/dL (ref 70–99)
Potassium: 4 mmol/L (ref 3.5–5.1)
Sodium: 142 mmol/L (ref 135–145)
Total Bilirubin: 0.3 mg/dL (ref 0.3–1.2)
Total Protein: 7.3 g/dL (ref 6.5–8.1)

## 2019-03-30 LAB — RETICULOCYTES
Immature Retic Fract: 11 % (ref 2.3–15.9)
RBC.: 4.21 MIL/uL (ref 3.87–5.11)
Retic Count, Absolute: 58.1 10*3/uL (ref 19.0–186.0)
Retic Ct Pct: 1.4 % (ref 0.4–3.1)

## 2019-03-30 LAB — CBC WITH DIFFERENTIAL/PLATELET
Abs Immature Granulocytes: 0.01 10*3/uL (ref 0.00–0.07)
Basophils Absolute: 0 10*3/uL (ref 0.0–0.1)
Basophils Relative: 1 %
Eosinophils Absolute: 0.2 10*3/uL (ref 0.0–0.5)
Eosinophils Relative: 3 %
HCT: 35.9 % — ABNORMAL LOW (ref 36.0–46.0)
Hemoglobin: 11.3 g/dL — ABNORMAL LOW (ref 12.0–15.0)
Immature Granulocytes: 0 %
Lymphocytes Relative: 36 %
Lymphs Abs: 2.2 10*3/uL (ref 0.7–4.0)
MCH: 27.7 pg (ref 26.0–34.0)
MCHC: 31.5 g/dL (ref 30.0–36.0)
MCV: 88 fL (ref 80.0–100.0)
Monocytes Absolute: 0.5 10*3/uL (ref 0.1–1.0)
Monocytes Relative: 8 %
Neutro Abs: 3.3 10*3/uL (ref 1.7–7.7)
Neutrophils Relative %: 52 %
Platelets: 196 10*3/uL (ref 150–400)
RBC: 4.08 MIL/uL (ref 3.87–5.11)
RDW: 13.2 % (ref 11.5–15.5)
WBC: 6.3 10*3/uL (ref 4.0–10.5)
nRBC: 0 % (ref 0.0–0.2)

## 2019-03-30 LAB — FOLATE: Folate: 12.9 ng/mL (ref >5.9)

## 2019-03-30 LAB — DIRECT ANTIGLOBULIN TEST (NOT AT ARMC)
DAT, IgG: NEGATIVE
DAT, complement: NEGATIVE

## 2019-03-30 LAB — LACTATE DEHYDROGENASE: LDH: 195 U/L — ABNORMAL HIGH (ref 98–192)

## 2019-03-30 LAB — VITAMIN B12: Vitamin B-12: 267 pg/mL (ref 180–914)

## 2019-03-30 LAB — SEDIMENTATION RATE: Sed Rate: 25 mm/hr — ABNORMAL HIGH (ref 0–22)

## 2019-03-30 NOTE — Progress Notes (Signed)
CLINIC:  Survivorship   REASON FOR VISIT:  Routine follow-up for history of breast cancer.   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 Gambrell presents to the [facility] today for routine follow-up for her history of breast cancer.  Overall, she reports feeling quite well.   She stopped tamoxifen last year.  She is seeing her PCP regularly.  She is up to date with colon cancer screening, she no longer requires pap smears as she underwent TAH in 2009. She has had some moles removed by dermatology, previously, but notes that it has been a while since she has seen them.    Dollene hasn't exercised since the gym has been closed.  She is planning on walking outside once she is less busy.  She is active at her jobs which is at [employer] and at [employer] in [employer].    Her last mammogram was in 04/2018 and showed no evidence of malignancy and breast density category B.    Zakaria had labs done today that indicate anemia.  She says she has had iron deficiency in the past, and wonders what the anemia is related to.    REVIEW OF SYSTEMS:  Review of Systems  Constitutional: Negative for appetite change, chills, fatigue, fever and unexpected weight change.  HENT:   Negative for hearing loss, lump/mass, sore throat and trouble swallowing.   Eyes: Negative for  eye problems and icterus.  Respiratory: Negative for chest tightness, cough and shortness of breath.   Cardiovascular: Negative for chest pain, leg swelling and palpitations.  Gastrointestinal: Negative for abdominal distention, abdominal pain, constipation, diarrhea, nausea and vomiting.  Endocrine: Negative for hot flashes.  Musculoskeletal: Negative for arthralgias.  Skin: Negative for itching and rash.  Neurological: Negative for dizziness, extremity weakness, headaches and numbness.  Hematological: Negative for adenopathy. Does not bruise/bleed easily.  Psychiatric/Behavioral: Negative for depression. The patient is not nervous/anxious.    Breast: Denies any new nodularity, masses, tenderness, nipple changes, or nipple discharge.       PAST MEDICAL/SURGICAL HISTORY:  Past Medical History:  Diagnosis Date  . Anemia   . Breast cancer, left breast ([place]) 2009  . Diabetes mellitus without complication ([place])   . Endometriosis   . History of blood transfusion   . Personal history of chemotherapy   . Personal history of radiation therapy    Past Surgical History:  Procedure Laterality Date  . ABDOMINAL HYSTERECTOMY Bilateral 12/2007   w/ BSO  . BREAST LUMPECTOMY  01/20/2008   left  . BREAST REDUCTION SURGERY     right  . WISDOM TOOTH EXTRACTION Bilateral      ALLERGIES:  No Known Allergies   CURRENT MEDICATIONS:  Outpatient Encounter Medications as of 03/30/2019  Medication Sig  . cholecalciferol (VITAMIN D) 400 UNITS TABS Take 2,000 Units  by mouth daily.    . citalopram (CELEXA) 40 MG tablet Take 40 mg by mouth daily.  . Dapagliflozin-metFORMIN HCl ER 02-999 MG TB24 Take by mouth daily.   . fish oil-omega-3 fatty acids 1000 MG capsule Take 1 g by mouth daily.    Marland Kitchen GARCINIA CAMBOGIA-CHROMIUM PO Take 1,000 mg by mouth daily. Reported on 07/26/2015  . ibuprofen (ADVIL,MOTRIN) 200 MG tablet Take 200 mg by mouth every 6 (six) hours as needed.    . Multiple Vitamin  (MULTIVITAMIN) tablet Take 1 tablet by mouth daily.    . pantoprazole (PROTONIX) 40 MG tablet Take 1 tablet (40 mg total) by mouth daily.  . [DISCONTINUED] JANUVIA 100 MG tablet   . [DISCONTINUED] tamoxifen (NOLVADEX) 20 MG tablet Take 1 tablet (20 mg total) daily by mouth. (Patient not taking: Reported on 03/30/2019)   No facility-administered encounter medications on file as of 03/30/2019.      ONCOLOGIC FAMILY HISTORY:  Family History  Problem Relation Age of Onset  . Hypertension Mother   . Arthritis Mother   . Heart attack Mother        cause of death  . Heart attack Father 59       cause of death  . Breast cancer Neg Hx   . Ovarian cancer Neg Hx     GENETIC COUNSELING/TESTING: See above  SOCIAL HISTORY:  Social History   Socioeconomic History  . Marital status: Married    Spouse name: Kermit  . Number of children: Not on file  . Years of education: Not on file  . Highest education level: Not on file  Occupational History  . Not on file  Social Needs  . Financial resource strain: Not on file  . Food insecurity    Worry: Not on file    Inability: Not on file  . Transportation needs    Medical: Not on file    Non-medical: Not on file  Tobacco Use  . Smoking status: Never Smoker  . Smokeless tobacco: Never Used  Substance and Sexual Activity  . Alcohol use: Yes    Alcohol/week: 1.0 standard drinks    Types: 1 Glasses of wine per week  . Drug use: No  . Sexual activity: Not on file  Lifestyle  . Physical activity    Days per week: Not on file    Minutes per session: Not on file  . Stress: Not on file  Relationships  . Social Herbalist on phone: Not on file    Gets together: Not on file    Attends religious service: Not on file    Active member of club or organization: Not on file    Attends meetings of clubs or organizations: Not on file    Relationship status: Not on file  . Intimate partner violence    Fear of current or ex partner: Not  on file    Emotionally abused: Not on file    Physically abused: Not on file    Forced sexual activity: Not on file  Other Topics Concern  . Not on file  Social History Narrative   2 adopted daughters      PHYSICAL EXAMINATION:  Vital Signs: Vitals:   03/30/19 1451  BP: (!) 143/75  Pulse: 87  Resp: 18  Temp: 97.9 F (36.6 C)  SpO2: 100%   Filed Weights   03/30/19 1451  Weight: 170 lb 14.4 oz (77.5 kg)   General: Well-nourished, well-appearing female in no  acute distress.  Unaccompanied today.   HEENT: Head is normocephalic.  Pupils equal and reactive to light. Conjunctivae clear without exudate.  Sclerae anicteric. Oral mucosa is pink, moist.  Oropharynx is pink without lesions or erythema.  Lymph: No cervical, supraclavicular, or infraclavicular lymphadenopathy noted on palpation.  Cardiovascular: Regular rate and rhythm.Marland Kitchen Respiratory: Clear to auscultation bilaterally. Chest expansion symmetric; breathing non-labored.  Breast Exam:  -Left breast: No appreciable masses on palpation. No skin redness, thickening, or peau d'orange appearance; no nipple retraction or nipple discharge; mild distortion in symmetry at previous lumpectomy site well healed scar without erythema or nodularity.  -Right breast: No appreciable masses on palpation. No skin redness, thickening, or peau d'orange appearance; no nipple retraction or nipple discharge;  -Axilla: No axillary adenopathy bilaterally.  GI: Abdomen soft and round; non-tender, non-distended. Bowel sounds normoactive. No hepatosplenomegaly.   GU: Deferred.  Neuro: No focal deficits. Steady gait.  Psych: Mood and affect normal and appropriate for situation.  MSK: No focal spinal tenderness to palpation, full range of motion in bilateral upper extremities Extremities: No edema. Skin: Warm and dry.  LABORATORY DATA:  None for this visit   DIAGNOSTIC IMAGING:  Most recent mammogram:  CLINICAL DATA:  Screening.  EXAM: DIGITAL  SCREENING BILATERAL MAMMOGRAM WITH TOMO AND CAD  COMPARISON:  Previous exam(s).  ACR Breast Density Category b: There are scattered areas of fibroglandular density.  FINDINGS: There are no findings suspicious for malignancy. Images were processed with CAD.  IMPRESSION: No mammographic evidence of malignancy. A result letter of this screening mammogram will be mailed directly to the patient.  RECOMMENDATION: Screening mammogram in one year. (Code:SM-B-01Y)  BI-RADS CATEGORY  1: Negative.   Electronically Signed   By: Lajean Manes M.D.   On: 05/05/2018 08:13   ASSESSMENT AND PLAN:  Ms.. Name is a pleasant 58 y.o. female with history of Stage IIB left breast invasive ductal carcinoma, ER+/PR+/HER2-, diagnosed in 2009, treated with lumpectomy, adjuvant chemotherapy, adjuvant radiation therapy, and anti-estrogen therapy with Tamoxifen x 10 years, completed in 07/2018.  She presents to the [facility] for surveillance and routine follow-up.   1. History of breast cancer:  Ms. Calia is currently clinically and radiographically without evidence of disease or recurrence of breast cancer. She has mammogram scheduled in 05/2019.  She will return in one year for continued LTS follow up.  I encouraged her to call me with any questions or concerns before her next visit at the cancer center, and I would be happy to see her sooner, if needed.    2.  Normocytic anemia: We will add on some labs today for further evaluation.    3. Bone health:    She was given education on specific food and activities to promote bone health.  4. Cancer screening:  Due to Ms. Moder's history and her age, she should receive screening for skin cancers, colon cancer, and gynecologic cancers. She was encouraged to follow-up with her PCP for appropriate cancer screenings.   5. Health maintenance and wellness promotion: Ms. Linan was encouraged to consume 5-7 servings of fruits and vegetables per day.  She was also encouraged to engage in moderate to vigorous exercise for 30 minutes per day most days of the week. She was instructed to limit her alcohol consumption and continue to abstain from tobacco use.    Dispo:  -Return to cancer center in one year for LTS follow up -Mammogram in 05/2019   A total of (20) minutes of face-to-face  time was spent with this patient with greater than 50% of that time in counseling and care-coordination.   Gardenia Phlegm, NP Survivorship Program [facility] [phone]   Note: PRIMARY CARE PROVIDER [name], [place] [phone]

## 2019-03-31 ENCOUNTER — Telehealth: Payer: Self-pay | Admitting: Adult Health

## 2019-03-31 LAB — HEMOGLOBINOPATHY EVALUATION
Hgb A2 Quant: 2.9 % (ref 1.8–3.2)
Hgb A: 97.1 % (ref 96.4–98.8)
Hgb C: 0 %
Hgb F Quant: 0 % (ref 0.0–2.0)
Hgb S Quant: 0 %
Hgb Variant: 0 %

## 2019-03-31 LAB — PROTEIN ELECTROPHORESIS, SERUM
A/G Ratio: 1 (ref 0.7–1.7)
Albumin ELP: 3.7 g/dL (ref 2.9–4.4)
Alpha-1-Globulin: 0.2 g/dL (ref 0.0–0.4)
Alpha-2-Globulin: 0.8 g/dL (ref 0.4–1.0)
Beta Globulin: 1.2 g/dL (ref 0.7–1.3)
Gamma Globulin: 1.4 g/dL (ref 0.4–1.8)
Globulin, Total: 3.6 g/dL (ref 2.2–3.9)
Total Protein ELP: 7.3 g/dL (ref 6.0–8.5)

## 2019-03-31 LAB — IRON AND TIBC
Iron: 83 ug/dL (ref 41–142)
Saturation Ratios: 20 % — ABNORMAL LOW (ref 21–57)
TIBC: 413 ug/dL (ref 236–444)
UIBC: 329 ug/dL (ref 120–384)

## 2019-03-31 LAB — FERRITIN: Ferritin: 32 ng/mL (ref 11–307)

## 2019-03-31 LAB — HAPTOGLOBIN: Haptoglobin: 161 mg/dL (ref 33–346)

## 2019-03-31 NOTE — Telephone Encounter (Signed)
I left a message regarding video visit with my chart support number as well

## 2019-04-05 LAB — ANTINUCLEAR ANTIBODIES, IFA: ANA Ab, IFA: NEGATIVE

## 2019-04-07 ENCOUNTER — Encounter: Payer: Self-pay | Admitting: Adult Health

## 2019-04-07 ENCOUNTER — Telehealth: Payer: Self-pay

## 2019-04-07 ENCOUNTER — Telehealth: Payer: Self-pay | Admitting: Adult Health

## 2019-04-07 ENCOUNTER — Inpatient Hospital Stay: Payer: Managed Care, Other (non HMO) | Attending: Adult Health | Admitting: Adult Health

## 2019-04-07 DIAGNOSIS — C50912 Malignant neoplasm of unspecified site of left female breast: Secondary | ICD-10-CM | POA: Diagnosis not present

## 2019-04-07 DIAGNOSIS — C50812 Malignant neoplasm of overlapping sites of left female breast: Secondary | ICD-10-CM | POA: Insufficient documentation

## 2019-04-07 DIAGNOSIS — Z170 Estrogen receptor positive status [ER+]: Secondary | ICD-10-CM

## 2019-04-07 DIAGNOSIS — D509 Iron deficiency anemia, unspecified: Secondary | ICD-10-CM | POA: Diagnosis not present

## 2019-04-07 NOTE — Progress Notes (Signed)
[facility] Follow up:    [name], [place] [address]   DIAGNOSIS: Cancer Staging Breast cancer, left breast [facility]) Staging form: Breast, AJCC 7th Edition - Clinical: Stage IIB (T2, N1, cM0) - Signed by Theotis Burrow, PA on 04/18/2011 - Pathologic: No stage assigned - Unsigned  I connected with Linda Kramer on 04/07/19 at  9:00 AM EST by telephone and verified that I am speaking with the correct person using two identifiers.   I discussed the limitations, risks, security and privacy concerns of performing an evaluation and management service virtually and the availability of in person appointments.  I also discussed with the patient that there may be a patient responsible charge related to this service. The patient expressed understanding and agreed to proceed.   Patient is at home, provider is in the office.   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 female with h/o iron deficiency.  On her last set of labs she was concerned due to her decreasing hemoglobin.  Labs showed mild iron deficiency.  She says that she had a few days of dark stools in September.  She has stool cards she plans to bring in tomorrow.  She reports that she is mildly fatigue.      Patient Active Problem List   Diagnosis Date Noted  . Iron deficiency anemia 04/07/2019  . Malignant neoplasm of overlapping sites of left breast in female, estrogen receptor positive ([place]) 02/25/2017  . Bilateral hand pain 10/16/2011  . Lymphedema of arm 10/16/2011  . Breast cancer, left breast ([place]) 04/18/2011    has No Known Allergies.  MEDICAL HISTORY: Past Medical History:  Diagnosis Date  . Anemia   . Breast cancer, left breast ([place]) 2009  . Diabetes mellitus without complication ([place])   . Endometriosis   . History of blood transfusion   . Personal history of chemotherapy   . Personal history of radiation therapy     SURGICAL HISTORY: Past Surgical History:  Procedure Laterality Date  . ABDOMINAL HYSTERECTOMY Bilateral 12/2007   w/ BSO  . BREAST LUMPECTOMY  01/20/2008   left  . BREAST REDUCTION SURGERY     right  . WISDOM TOOTH EXTRACTION Bilateral     SOCIAL HISTORY: Social History   Socioeconomic History  . Marital status: Married    Spouse name: Kermit  . Number of children: Not on file  . Years of education: Not on file  . Highest education level: Not on file  Occupational History  . Not on file  Social Needs  . Financial resource strain: Not on file  . Food insecurity    Worry: Not on file    Inability: Not on file  . Transportation needs    Medical: Not  on file    Non-medical: Not on file  Tobacco Use  . Smoking status: Never Smoker  . Smokeless tobacco: Never Used  Substance and Sexual Activity  . Alcohol use: Yes    Alcohol/week: 1.0 standard drinks    Types: 1 Glasses of wine per week  . Drug use: No  . Sexual activity: Not on file  Lifestyle  . Physical activity    Days per week: Not on file    Minutes per session: Not on file  . Stress: Not on file  Relationships  . Social Herbalist on phone: Not on file    Gets together: Not on file    Attends religious service: Not on file    Active member of club or  organization: Not on file    Attends meetings of clubs or organizations: Not on file    Relationship status: Not on file  . Intimate partner violence    Fear of current or ex partner: Not on file    Emotionally abused: Not on file    Physically abused: Not on file    Forced sexual activity: Not on file  Other Topics Concern  . Not on file  Social History Narrative   2 adopted daughters    FAMILY HISTORY: Family History  Problem Relation Age of Onset  . Hypertension Mother   . Arthritis Mother   . Heart attack Mother        cause of death  . Heart attack Father 46       cause of death  . Breast cancer Neg Hx   . Ovarian cancer Neg Hx     Review of Systems  Constitutional: Positive for fatigue. Negative for appetite change, chills and unexpected weight change.  HENT:   Negative for hearing loss, lump/mass, nosebleeds, sore throat and trouble swallowing.   Eyes: Negative for eye problems and icterus.  Respiratory: Negative for chest tightness, cough and shortness of breath.   Cardiovascular: Negative for chest pain, leg swelling and palpitations.  Gastrointestinal: Negative for abdominal distention, abdominal pain, constipation, diarrhea, nausea and vomiting.  Endocrine: Negative for hot flashes.  Genitourinary: Negative for difficulty urinating.   Musculoskeletal: Negative for arthralgias.  Skin: Negative for itching and rash.  Neurological: Negative for dizziness, extremity weakness, headaches and numbness.  Hematological: Negative for adenopathy. Does not bruise/bleed easily.  Psychiatric/Behavioral: Negative for depression. The patient is not nervous/anxious.       PHYSICAL EXAMINATION  ECOG PERFORMANCE STATUS: 1 - Symptomatic but completely ambulatory Patient sounds well.  In no apparent distress.  Mood and behavior are normal. Speech is normal.      LABORATORY DATA:  CBC    Component Value Date/Time   WBC 6.3 03/30/2019 1417   RBC 4.21 03/30/2019 1527    RBC 4.08 03/30/2019 1417   HGB 11.3 (L) 03/30/2019 1417   HGB 11.5 (L) 02/25/2018 1456   HGB 11.8 02/25/2017 1450   HCT 35.9 (L) 03/30/2019 1417   HCT 37.1 02/25/2017 1450   PLT 196 03/30/2019 1417   PLT 202 02/25/2018 1456   PLT 194 02/25/2017 1450   MCV 88.0 03/30/2019 1417   MCV 88.5 02/25/2017 1450   MCH 27.7 03/30/2019 1417   MCHC 31.5 03/30/2019 1417   RDW 13.2 03/30/2019 1417   RDW 13.4 02/25/2017 1450   LYMPHSABS 2.2 03/30/2019 1417   LYMPHSABS 2.9 02/25/2017 1450   MONOABS 0.5 03/30/2019 1417   MONOABS 0.5 02/25/2017 1450  EOSABS 0.2 03/30/2019 1417   EOSABS 0.1 02/25/2017 1450   BASOSABS 0.0 03/30/2019 1417   BASOSABS 0.0 02/25/2017 1450    CMP     Component Value Date/Time   NA 142 03/30/2019 1417   NA 139 02/25/2017 1450   K 4.0 03/30/2019 1417   K 4.2 02/25/2017 1450   CL 104 03/30/2019 1417   CL 103 07/09/2012 1500   CO2 27 03/30/2019 1417   CO2 27 02/25/2017 1450   GLUCOSE 99 03/30/2019 1417   GLUCOSE 130 02/25/2017 1450   GLUCOSE 141 (H) 07/09/2012 1500   BUN 13 03/30/2019 1417   BUN 11.0 02/25/2017 1450   CREATININE 0.91 03/30/2019 1417   CREATININE 0.78 02/25/2018 1456   CREATININE 0.8 02/25/2017 1450   CALCIUM 8.8 (L) 03/30/2019 1417   CALCIUM 8.9 02/25/2017 1450   PROT 7.3 03/30/2019 1417   PROT 7.2 02/25/2017 1450   ALBUMIN 3.7 03/30/2019 1417   ALBUMIN 3.6 02/25/2017 1450   AST 21 03/30/2019 1417   AST 20 02/25/2018 1456   AST 19 02/25/2017 1450   ALT 20 03/30/2019 1417   ALT 21 02/25/2018 1456   ALT 16 02/25/2017 1450   ALKPHOS 113 03/30/2019 1417   ALKPHOS 96 02/25/2017 1450   BILITOT 0.3 03/30/2019 1417   BILITOT <0.2 (L) 02/25/2018 1456   BILITOT <0.22 02/25/2017 1450   GFRNONAA >60 03/30/2019 1417   GFRNONAA >60 02/25/2018 1456   GFRAA >60 03/30/2019 1417   GFRAA >60 02/25/2018 1456          ASSESSMENT and PLAN:   Iron deficiency anemia Patient ferritin is 32, and sed rate is 25, reviewed with Dr. Jana Hakim, the  lab work, which indicates an iron deficiency, as ferritin is an acute phase reactant, and higher due to the elevated sed rate.  I reviewed Linda Kramer's labs with her in detail.  She will bring her stool cards in tomorrow, and start a slow iron supplement daily.  Her hemoglobin is very mildly decreased, so I reassured her that she will not need a blood transfusion.    We will see Linda Kramer back in 3 months for labs and f/u.     Follow up instructions:  -Labs and f/u in 3 months   The patient was provided an opportunity to ask questions and all were answered. The patient agreed with the plan and demonstrated an understanding of the instructions.   The patient was advised to call back or seek an in-person evaluation if the symptoms worsen or if the condition fails to improve as anticipated.   I provided 15 minutes of non face-to-face telephone visit time during this encounter, and > 50% was spent counseling as documented under my assessment & plan.  This note was electronically signed. Scot Dock, NP 04/07/2019

## 2019-04-07 NOTE — Telephone Encounter (Signed)
I talk with patient regarding schedule  

## 2019-04-07 NOTE — Telephone Encounter (Signed)
patient was seen at [facility] for colonoscopy on September 01, 2010, Dr. Penelope Coop

## 2019-04-07 NOTE — Assessment & Plan Note (Signed)
Patient ferritin is 32, and sed rate is 25, reviewed with Dr. Jana Hakim, the lab work, which indicates an iron deficiency, as ferritin is an acute phase reactant, and higher due to the elevated sed rate.  I reviewed Linda Kramer's labs with her in detail.  She will bring her stool cards in tomorrow, and start a slow iron supplement daily.  Her hemoglobin is very mildly decreased, so I reassured her that she will not need a blood transfusion.    We will see [name] back in 3 months for labs and f/u.

## 2019-04-10 ENCOUNTER — Other Ambulatory Visit: Payer: Self-pay | Admitting: Adult Health

## 2019-04-10 ENCOUNTER — Inpatient Hospital Stay: Payer: Managed Care, Other (non HMO)

## 2019-04-10 DIAGNOSIS — D509 Iron deficiency anemia, unspecified: Secondary | ICD-10-CM

## 2019-04-10 DIAGNOSIS — C50812 Malignant neoplasm of overlapping sites of left female breast: Secondary | ICD-10-CM | POA: Diagnosis not present

## 2019-04-10 LAB — OCCULT BLOOD X 1 CARD TO LAB, STOOL
Fecal Occult Bld: NEGATIVE
Fecal Occult Bld: NEGATIVE
Fecal Occult Bld: NEGATIVE

## 2019-05-21 ENCOUNTER — Ambulatory Visit
Admission: RE | Admit: 2019-05-21 | Discharge: 2019-05-21 | Disposition: A | Discharge status: Home / Self Care | Payer: Managed Care, Other (non HMO) | Source: Ambulatory Visit | Attending: Oncology | Admitting: Oncology

## 2019-05-21 ENCOUNTER — Other Ambulatory Visit: Payer: Self-pay

## 2019-05-21 DIAGNOSIS — Z1231 Encounter for screening mammogram for malignant neoplasm of breast: Secondary | ICD-10-CM

## 2019-06-18 ENCOUNTER — Telehealth: Payer: Self-pay | Admitting: Adult Health

## 2019-06-18 NOTE — Telephone Encounter (Signed)
Rescheduled per provider. Called and left a msg. Mailing printout  

## 2019-06-30 ENCOUNTER — Telehealth: Payer: Self-pay

## 2019-06-30 NOTE — Telephone Encounter (Signed)
PROGRESS NOTE: patient left voicemail stating that she needs to reschedule her appointment on 07/07/19 with Mendel Ryder NP to Friday 07/10/19. Sent over schedule to get this rescheduled for her.

## 2019-07-06 ENCOUNTER — Other Ambulatory Visit: Payer: Managed Care, Other (non HMO)

## 2019-07-06 ENCOUNTER — Ambulatory Visit: Payer: Managed Care, Other (non HMO) | Admitting: Adult Health

## 2019-07-07 ENCOUNTER — Ambulatory Visit: Payer: Managed Care, Other (non HMO) | Admitting: Adult Health

## 2019-07-07 ENCOUNTER — Other Ambulatory Visit: Payer: Managed Care, Other (non HMO)

## 2019-07-10 ENCOUNTER — Ambulatory Visit: Payer: Managed Care, Other (non HMO) | Admitting: Adult Health

## 2019-07-10 ENCOUNTER — Other Ambulatory Visit: Payer: Managed Care, Other (non HMO)

## 2019-08-05 ENCOUNTER — Encounter: Payer: Self-pay | Admitting: Adult Health

## 2019-08-05 ENCOUNTER — Inpatient Hospital Stay: Payer: Managed Care, Other (non HMO) | Attending: Adult Health

## 2019-08-05 ENCOUNTER — Inpatient Hospital Stay (HOSPITAL_BASED_OUTPATIENT_CLINIC_OR_DEPARTMENT_OTHER): Payer: Managed Care, Other (non HMO) | Admitting: Adult Health

## 2019-08-05 ENCOUNTER — Other Ambulatory Visit: Payer: Self-pay

## 2019-08-05 VITALS — BP 133/76 | HR 76 | Temp 97.4°F | Resp 18 | Ht 64.0 in | Wt 168.5 lb

## 2019-08-05 DIAGNOSIS — M25551 Pain in right hip: Secondary | ICD-10-CM | POA: Diagnosis not present

## 2019-08-05 DIAGNOSIS — Z170 Estrogen receptor positive status [ER+]: Secondary | ICD-10-CM

## 2019-08-05 DIAGNOSIS — D649 Anemia, unspecified: Secondary | ICD-10-CM | POA: Insufficient documentation

## 2019-08-05 DIAGNOSIS — M7989 Other specified soft tissue disorders: Secondary | ICD-10-CM | POA: Diagnosis not present

## 2019-08-05 DIAGNOSIS — I890 Lymphedema, not elsewhere classified: Secondary | ICD-10-CM

## 2019-08-05 DIAGNOSIS — Z923 Personal history of irradiation: Secondary | ICD-10-CM | POA: Diagnosis not present

## 2019-08-05 DIAGNOSIS — Z853 Personal history of malignant neoplasm of breast: Secondary | ICD-10-CM | POA: Diagnosis not present

## 2019-08-05 DIAGNOSIS — Z9221 Personal history of antineoplastic chemotherapy: Secondary | ICD-10-CM | POA: Diagnosis not present

## 2019-08-05 DIAGNOSIS — C50912 Malignant neoplasm of unspecified site of left female breast: Secondary | ICD-10-CM

## 2019-08-05 DIAGNOSIS — C50812 Malignant neoplasm of overlapping sites of left female breast: Secondary | ICD-10-CM

## 2019-08-05 LAB — COMPREHENSIVE METABOLIC PANEL
ALT: 13 U/L (ref 0–44)
AST: 16 U/L (ref 15–41)
Albumin: 3.7 g/dL (ref 3.5–5.0)
Alkaline Phosphatase: 130 U/L — ABNORMAL HIGH (ref 38–126)
Anion gap: 8 (ref 5–15)
BUN: 12 mg/dL (ref 6–20)
CO2: 28 mmol/L (ref 22–32)
Calcium: 9 mg/dL (ref 8.9–10.3)
Chloride: 104 mmol/L (ref 98–111)
Creatinine, Ser: 0.88 mg/dL (ref 0.44–1.00)
GFR calc Af Amer: >60 mL/min (ref >60)
GFR calc non Af Amer: >60 mL/min (ref >60)
Glucose, Bld: 116 mg/dL — ABNORMAL HIGH (ref 70–99)
Potassium: 4 mmol/L (ref 3.5–5.1)
Sodium: 140 mmol/L (ref 135–145)
Total Bilirubin: 0.4 mg/dL (ref 0.3–1.2)
Total Protein: 7.6 g/dL (ref 6.5–8.1)

## 2019-08-05 LAB — CBC WITH DIFFERENTIAL/PLATELET
Abs Immature Granulocytes: 0.01 10*3/uL (ref 0.00–0.07)
Basophils Absolute: 0 10*3/uL (ref 0.0–0.1)
Basophils Relative: 1 %
Eosinophils Absolute: 0.2 10*3/uL (ref 0.0–0.5)
Eosinophils Relative: 3 %
HCT: 39.2 % (ref 36.0–46.0)
Hemoglobin: 12.3 g/dL (ref 12.0–15.0)
Immature Granulocytes: 0 %
Lymphocytes Relative: 42 %
Lymphs Abs: 2.5 10*3/uL (ref 0.7–4.0)
MCH: 28.1 pg (ref 26.0–34.0)
MCHC: 31.4 g/dL (ref 30.0–36.0)
MCV: 89.5 fL (ref 80.0–100.0)
Monocytes Absolute: 0.4 10*3/uL (ref 0.1–1.0)
Monocytes Relative: 7 %
Neutro Abs: 2.9 10*3/uL (ref 1.7–7.7)
Neutrophils Relative %: 47 %
Platelets: 203 10*3/uL (ref 150–400)
RBC: 4.38 MIL/uL (ref 3.87–5.11)
RDW: 13.4 % (ref 11.5–15.5)
WBC: 6 10*3/uL (ref 4.0–10.5)
nRBC: 0 % (ref 0.0–0.2)

## 2019-08-05 NOTE — Progress Notes (Signed)
ID: Linda Kramer   DOB: 1961/05/06  MR#: [id number]  QTM#:[id number]  PCP: [name], MD GYN: SU[facility] Stabs Streck] OTHER MD:   CHIEF COMPLAINT: Estrogen receptor positive breast cancer  CURRENT TREATMENT: observation  HISTORY OF PRESENT ILLNESS: From her initial intake note:  The patient had her routine screening mammogram on 10/22/2007 which showed a possible mass in the left breast. A left diagnostic mammogram and ultrasound on 11/10/2007 with spot compression views showed a 2.5 cm mass in the upper left breast posteriorly, also palpable. All ultrasound confirmed a 2.6 the regular hypoechoic lesion with an adjacent 1 cm angulated satellite lesion. Biopsy on 11/10/2007 [phone] and [id number]) showed an invasive ductal carcinoma, ER positive at 87%, PR positive at 12%, HER-2/neu negative, with a very low proliferation fraction of 3%.  Bilateral breast MRIs were obtained 11/18/2007, again noting a 2.3 cm oval mass in the left breast with spiculated margins and a satellite nodule immediately anterior to this. Together, both masses measured 3.4 cm. No abnormality noted in the right breast.  Patient underwent left lumpectomy and sentinel lymph node sampling on 12/08/2007 under the care of Dr. Margot Chimes, with final pathology [phone]) confirming a 3.8 cm invasive ductal carcinoma, grade 2 with 2 of 4 sentinel lymph nodes involved. 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  Patient underwent additional surgery for clear margin in September 2009. Consisted of a left partial mastectomy with removal of the areolar complex. Axillary node dissection was also performed, an additional 6 lymph nodes evaluated, one of which was involved with metastatic ductal carcinoma. At the time of surgery, patient also underwent total abdominal hysterectomy and bilateral salpingo-oophorectomy with benign pathology. [phone]) The patient underwent radiation therapy, after which  she began on tamoxifen in March of 2010. Tamoxifen was continued until August of 2012 at which time the patient switched to letrozole. Letrozole was then discontinued in February 2013 due to arthralgias and myalgias. After a three-month "wash out" period,the  patient started back on tamoxifen in June of 2013.  Her subsequent history is as detailed below  INTERVAL HISTORY:  Linda Kramer is here for f/u of her breast cancer and anemia.  Her anemia has resolved.  She completed Tamoxifen x 10 years last year.  She notes new left leg swelling and she notes it has been present for a while but not improved.  She denies any left leg injury.  She also has pain in her right hip and lateral leg.  She notes it feels irritated.    Her most recent mammogram was on 05/25/2019 and showed no evidence of malignancy and breast density category C.    REVIEW OF SYSTEMS: Linda Kramer denies any hip injury.  She does increased pulling and pushing at work.  She notes she favors her left side, and tends to put more stress on her right.  Her fatigue is improved.  She denies any fever, chills, chest pain, palpitations, cough, shortness of breath, headaches, vision issues, nausea, vomiting, bowel/bladder changes, or any other concerns.  A detailed ROS was otherwise non contributory.     PAST MEDICAL HISTORY: Past Medical History:  Diagnosis Date  . Anemia   . Breast cancer, left breast ([place]) 2009  . Diabetes mellitus without complication ([place])   . Endometriosis   . History of blood transfusion   . Personal history of chemotherapy   . Personal history of radiation therapy   Significant for fibroids, varicose veins.   PAST SURGICAL HISTORY: Past  Surgical History:  Procedure Laterality Date  . ABDOMINAL HYSTERECTOMY Bilateral 12/2007   w/ BSO  . BREAST LUMPECTOMY  01/20/2008   left  . BREAST REDUCTION SURGERY     right  . REDUCTION MAMMAPLASTY Right   . WISDOM TOOTH EXTRACTION Bilateral     FAMILY HISTORY Family  History  Problem Relation Age of Onset  . Hypertension Mother   . Arthritis Mother   . Heart attack Mother        cause of death  . Heart attack Father 46       cause of death  . Breast cancer Neg Hx   . Ovarian cancer Neg Hx    The patient's father died from a heart attack at age 66. The patient's mother passed away 2017/09/04 from heart issues and was in her 53s. The patient is one of 8 siblings, 5 girls and 3 boys. One sister died in an automobile accident remotely. There is no known breast or ovarian cancer in the family  GYNECOLOGIC HISTORY:  Linda Kramer P0, status post total abdominal hysterectomy and bilateral salpingo-oophorectomy in August 2009.   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  Her husband, Linda Kramer, is an Event organiser. They have 2 adopted daughters, Linda Kramer and Linda Kramer.The patient is a Psychologist, forensic.    ADVANCED DIRECTIVES: Not in place  HEALTH MAINTENANCE: Social History   Tobacco Use  . Smoking status: Never Smoker  . Smokeless tobacco: Never Used  Substance Use Topics  . Alcohol use: Yes    Alcohol/week: 1.0 standard drinks    Types: 1 Glasses of wine per week  . Drug use: No     Colonoscopy: 2012  PAP: UTD  Bone density: DEXA scan May 2012 was normal  Lipid panel: Dr. [name]  No Known Allergies  Current Outpatient Medications  Medication Sig Dispense Refill  . Azelastine HCl 0.15 % SOLN Place 2 sprays into both nostrils daily.    . cholecalciferol (VITAMIN D) 400 UNITS TABS Take 2,000 Units by mouth daily.      . citalopram (CELEXA) 40 MG tablet Take 40 mg by mouth daily.  1  . Dapagliflozin-metFORMIN HCl ER 02-999 MG TB24 Take by mouth daily.     . fish oil-omega-3 fatty acids 1000 MG capsule Take 1 g by mouth daily.      Marland Kitchen ibuprofen (ADVIL,MOTRIN) 200 MG tablet Take 200 mg by mouth every 6 (six) hours as needed.      . Multiple  Vitamin (MULTIVITAMIN) tablet Take 1 tablet by mouth daily.      . pantoprazole (PROTONIX) 40 MG tablet Take 1 tablet (40 mg total) by mouth daily. 90 tablet 3  . RYBELSUS 3 MG TABS Take 3 mg by mouth daily.     No current facility-administered medications for this visit.    OBJECTIVE:  ECOG FS: 0  Vitals:   08/05/19 1335  BP: 133/76  Pulse: 76  Resp: 18  Temp: (!) 97.4 F (36.3 C)  TempSrc: Temporal  SpO2: 100%  Weight: 168 lb 8 oz (76.4 kg)  Height: 5' 4"  (1.626 m)  Body mass index is 28.92 kg/m.  GENERAL: Patient is a well appearing female in no acute distress HEENT:  Sclerae anicteric.  Oropharynx clear and moist. No ulcerations or evidence of oropharyngeal candidiasis. Neck is supple.  NODES:  No cervical, supraclavicular, or axillary lymphadenopathy  palpated.  BREAST EXAM: bilateral breasts are benign, no sign of local recurrence noted LUNGS:  Clear to auscultation bilaterally.  No wheezes or rhonchi. HEART:  Regular rate and rhythm. No murmur appreciated. ABDOMEN:  Soft, nontender.  Positive, normoactive bowel sounds. No organomegaly palpated. MSK:  No focal spinal tenderness to palpation. Full range of motion bilaterally in the upper extremities. EXTREMITIES:  Slight left leg swelling SKIN:  Clear with no obvious rashes or skin changes. No nail dyscrasia. NEURO:  Nonfocal. Well oriented.  Appropriate affect.   LAB RESULTS: Lab Results  Component Value Date   WBC 6.0 08/05/2019   NEUTROABS 2.9 08/05/2019   HGB 12.3 08/05/2019   HCT 39.2 08/05/2019   MCV 89.5 08/05/2019   PLT 203 08/05/2019      Chemistry      Component Value Date/Time   NA 140 08/05/2019 1321   NA 139 02/25/2017 1450   K 4.0 08/05/2019 1321   K 4.2 02/25/2017 1450   CL 104 08/05/2019 1321   CL 103 07/09/2012 1500   CO2 28 08/05/2019 1321   CO2 27 02/25/2017 1450   BUN 12 08/05/2019 1321   BUN 11.0 02/25/2017 1450   CREATININE 0.88 08/05/2019 1321   CREATININE 0.78 02/25/2018 1456    CREATININE 0.8 02/25/2017 1450      Component Value Date/Time   CALCIUM 9.0 08/05/2019 1321   CALCIUM 8.9 02/25/2017 1450   ALKPHOS 130 (H) 08/05/2019 1321   ALKPHOS 96 02/25/2017 1450   AST 16 08/05/2019 1321   AST 20 02/25/2018 1456   AST 19 02/25/2017 1450   ALT 13 08/05/2019 1321   ALT 21 02/25/2018 1456   ALT 16 02/25/2017 1450   BILITOT 0.4 08/05/2019 1321   BILITOT <0.2 (L) 02/25/2018 1456   BILITOT <0.22 02/25/2017 1450       Lab Results  Component Value Date   LABCA2 25 07/16/2013    STUDIES: No results found.   ASSESSMENT: 59 y.o. BRCA 1-2 negative [place] woman   1. Status post left lumpectomy and sentinel lymph node biopsy in August 2009 for a T2 N1, stage IIB, invasive ductal carcinoma, grade 2, estrogen receptor 87% positive, progesterone receptor 12% positive, with an MIB-1 of 3% and no HER-2 amplification   2. 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   3. Received radiation therapy, completed March 2010   4. Started on tamoxifen in March of 2010 and continued until August of 2012.   5. Began on letrozole in August 2012, discontinued February 2013 because of arthralgias/myalgias.  Resumed tamoxifen in June 2013.  Discontinued December 2019  6. Status post TAH/BSO in August 2009 with benign pathology   PLAN:  Juliah is doing well today.  She has completed her breast cancer treatment and has no signs of recurrence.  Her anemia has resolved.  She understands that she should have an annual mammogram and clinical breast exam.  She is comfortable doing this with her PCP.    I think her pain in her right hip is musculoskeletal related to favoring that side due to heavy lifting/pushing/pulling at work.  I did order a doppler of her left leg to r/o DVT as she was previously on Tamoxifen.    I discussed healthy diet and exercise with Linda Kramer in  detail.  I reviewed the importance of seeing her PCP regularly, and staying up to date with her other cancer  screenings.  I let her know that we are happy to see her in the future at any time should she need [place].    She was recommended to continue with the appropriate pandemic precautions. She knows to call for any questions that may arise between now and her next appointment.  We are happy to see her sooner if needed.   Total encounter time: 20 minutes*  Wilber Bihari, NP 08/05/19 2:34 PM Medical Oncology and Hematology [facility] [place], [place] [address] Tel. [phone]    Fax. [phone]  *Total Encounter Time as defined by the Centers for Medicare and Medicaid Services includes, in addition to the face-to-face time of a patient visit (documented in the note above) non-face-to-face time: obtaining and reviewing outside history, ordering and reviewing medications, tests or procedures, care coordination (communications with other health care professionals or caregivers) and documentation in the medical record.

## 2019-08-06 ENCOUNTER — Telehealth: Payer: Self-pay | Admitting: Adult Health

## 2019-08-06 NOTE — Telephone Encounter (Signed)
No 3/31 LOS. No changes made to pt's schedule.

## 2019-08-11 ENCOUNTER — Ambulatory Visit (HOSPITAL_COMMUNITY)
Admission: RE | Admit: 2019-08-11 | Discharge: 2019-08-11 | Disposition: A | Discharge status: Home / Self Care | Payer: Managed Care, Other (non HMO) | Source: Ambulatory Visit | Attending: Adult Health | Admitting: Adult Health

## 2019-08-11 ENCOUNTER — Other Ambulatory Visit: Payer: Self-pay

## 2019-08-11 DIAGNOSIS — M7989 Other specified soft tissue disorders: Secondary | ICD-10-CM | POA: Diagnosis not present

## 2019-08-11 NOTE — Progress Notes (Signed)
Lower extremity venous duplex       has been completed. Preliminary results can be found under CV proc through chart review. June Leap, BS, RDMS, RVT    Called report to Thedore Mins and left voice message

## 2019-12-18 ENCOUNTER — Encounter: Payer: Self-pay | Admitting: Genetic Counselor

## 2019-12-21 ENCOUNTER — Telehealth: Payer: Self-pay | Admitting: Oncology

## 2019-12-21 NOTE — Telephone Encounter (Signed)
Release: [id number] Faxed medical records to [facility] @ fax#[phone]

## 2020-04-25 ENCOUNTER — Other Ambulatory Visit: Payer: Self-pay | Admitting: Oncology

## 2020-04-25 DIAGNOSIS — Z1231 Encounter for screening mammogram for malignant neoplasm of breast: Secondary | ICD-10-CM

## 2020-06-02 ENCOUNTER — Ambulatory Visit
Admission: RE | Admit: 2020-06-02 | Discharge: 2020-06-02 | Disposition: A | Discharge status: Home / Self Care | Payer: Managed Care, Other (non HMO) | Source: Ambulatory Visit | Attending: Oncology | Admitting: Oncology

## 2020-06-02 ENCOUNTER — Other Ambulatory Visit: Payer: Self-pay

## 2020-06-02 DIAGNOSIS — Z1231 Encounter for screening mammogram for malignant neoplasm of breast: Secondary | ICD-10-CM

## 2020-06-02 HISTORY — DX: Malignant neoplasm of unspecified site of unspecified female breast: C50.919

## 2021-05-15 ENCOUNTER — Other Ambulatory Visit: Payer: Self-pay | Admitting: Family Medicine

## 2021-05-15 DIAGNOSIS — Z1231 Encounter for screening mammogram for malignant neoplasm of breast: Secondary | ICD-10-CM

## 2021-06-05 ENCOUNTER — Ambulatory Visit: Payer: Managed Care, Other (non HMO)

## 2021-06-16 ENCOUNTER — Ambulatory Visit
Admission: RE | Admit: 2021-06-16 | Discharge: 2021-06-16 | Disposition: A | Discharge status: Home / Self Care | Payer: Managed Care, Other (non HMO) | Source: Ambulatory Visit | Attending: Family Medicine | Admitting: Family Medicine

## 2021-06-16 DIAGNOSIS — Z1231 Encounter for screening mammogram for malignant neoplasm of breast: Secondary | ICD-10-CM

## 2021-07-03 ENCOUNTER — Other Ambulatory Visit (HOSPITAL_COMMUNITY): Payer: Self-pay | Admitting: Gastroenterology

## 2021-07-03 ENCOUNTER — Other Ambulatory Visit: Payer: Self-pay | Admitting: Gastroenterology

## 2021-07-03 DIAGNOSIS — R1013 Epigastric pain: Secondary | ICD-10-CM

## 2021-07-10 ENCOUNTER — Ambulatory Visit (HOSPITAL_COMMUNITY): Payer: Managed Care, Other (non HMO)

## 2021-08-15 ENCOUNTER — Ambulatory Visit (HOSPITAL_COMMUNITY): Admission: RE | Admit: 2021-08-15 | Payer: Managed Care, Other (non HMO) | Source: Ambulatory Visit

## 2021-08-15 ENCOUNTER — Encounter (HOSPITAL_COMMUNITY): Payer: Self-pay

## 2021-12-13 ENCOUNTER — Other Ambulatory Visit (HOSPITAL_COMMUNITY): Payer: Self-pay | Admitting: Gastroenterology

## 2021-12-13 DIAGNOSIS — R1013 Epigastric pain: Secondary | ICD-10-CM

## 2022-01-03 ENCOUNTER — Other Ambulatory Visit (HOSPITAL_COMMUNITY): Payer: Managed Care, Other (non HMO)

## 2022-01-18 ENCOUNTER — Ambulatory Visit (HOSPITAL_COMMUNITY)
Admission: RE | Admit: 2022-01-18 | Discharge: 2022-01-18 | Disposition: A | Discharge status: Home / Self Care | Payer: Managed Care, Other (non HMO) | Source: Ambulatory Visit | Attending: Gastroenterology | Admitting: Gastroenterology

## 2022-01-18 DIAGNOSIS — R1013 Epigastric pain: Secondary | ICD-10-CM | POA: Insufficient documentation

## 2022-01-18 MED ORDER — TECHNETIUM TC 99M SULFUR COLLOID
2.0000 | Freq: Once | INTRAVENOUS | Status: AC | PRN
  Administered 2022-01-18: 2 via INTRAVENOUS

## 2022-05-25 ENCOUNTER — Other Ambulatory Visit: Payer: Self-pay | Admitting: Family Medicine

## 2022-05-25 DIAGNOSIS — Z1231 Encounter for screening mammogram for malignant neoplasm of breast: Secondary | ICD-10-CM

## 2022-07-13 ENCOUNTER — Ambulatory Visit
Admission: RE | Admit: 2022-07-13 | Discharge: 2022-07-13 | Disposition: A | Discharge status: Home / Self Care | Payer: Managed Care, Other (non HMO) | Source: Ambulatory Visit | Attending: Family Medicine | Admitting: Family Medicine

## 2022-07-13 DIAGNOSIS — Z1231 Encounter for screening mammogram for malignant neoplasm of breast: Secondary | ICD-10-CM

## 2022-07-20 ENCOUNTER — Other Ambulatory Visit: Payer: Self-pay | Admitting: Family Medicine

## 2022-07-20 DIAGNOSIS — E2839 Other primary ovarian failure: Secondary | ICD-10-CM

## 2023-01-22 ENCOUNTER — Ambulatory Visit
Admission: RE | Admit: 2023-01-22 | Discharge: 2023-01-22 | Disposition: A | Discharge status: Home / Self Care | Payer: Managed Care, Other (non HMO) | Source: Ambulatory Visit | Attending: Family Medicine | Admitting: Family Medicine

## 2023-01-22 DIAGNOSIS — E2839 Other primary ovarian failure: Secondary | ICD-10-CM

## 2023-07-22 ENCOUNTER — Other Ambulatory Visit (HOSPITAL_COMMUNITY): Payer: Self-pay | Admitting: Family Medicine

## 2023-07-22 DIAGNOSIS — E113293 Type 2 diabetes mellitus with mild nonproliferative diabetic retinopathy without macular edema, bilateral: Secondary | ICD-10-CM

## 2023-08-13 ENCOUNTER — Other Ambulatory Visit: Payer: Self-pay | Admitting: Family Medicine

## 2023-08-13 DIAGNOSIS — Z1231 Encounter for screening mammogram for malignant neoplasm of breast: Secondary | ICD-10-CM

## 2023-08-14 ENCOUNTER — Ambulatory Visit
Admission: RE | Admit: 2023-08-14 | Discharge: 2023-08-14 | Discharge status: Home / Self Care | Source: Ambulatory Visit | Attending: Family Medicine | Admitting: Family Medicine

## 2023-08-14 DIAGNOSIS — Z1231 Encounter for screening mammogram for malignant neoplasm of breast: Secondary | ICD-10-CM

## 2023-08-16 ENCOUNTER — Ambulatory Visit (HOSPITAL_COMMUNITY)
Admission: RE | Admit: 2023-08-16 | Discharge: 2023-08-16 | Disposition: A | Discharge status: Home / Self Care | Payer: Self-pay | Source: Ambulatory Visit | Attending: Family Medicine | Admitting: Family Medicine

## 2023-08-16 DIAGNOSIS — E113293 Type 2 diabetes mellitus with mild nonproliferative diabetic retinopathy without macular edema, bilateral: Secondary | ICD-10-CM | POA: Insufficient documentation
